# Patient Record
Sex: Female | Born: 1990 | Race: Black or African American | Hispanic: No | Marital: Single | Smoking: Never smoker
Health system: Southern US, Community
[De-identification: ages and names within clinical notes are randomized; demographics above are authoritative.]

## PROBLEM LIST (undated history)

## (undated) DIAGNOSIS — B999 Unspecified infectious disease: Secondary | ICD-10-CM

## (undated) HISTORY — PX: NO PAST SURGERIES: SHX2092

---

## 2009-07-03 ENCOUNTER — Inpatient Hospital Stay (HOSPITAL_COMMUNITY): Admission: AD | Admit: 2009-07-03 | Discharge: 2009-07-05 | Payer: Self-pay | Admitting: Obstetrics and Gynecology

## 2009-07-03 ENCOUNTER — Encounter (INDEPENDENT_AMBULATORY_CARE_PROVIDER_SITE_OTHER): Payer: Self-pay | Admitting: Obstetrics and Gynecology

## 2011-04-02 LAB — CBC
HCT: 31.3 % — ABNORMAL LOW (ref 36.0–46.0)
Hemoglobin: 10.7 g/dL — ABNORMAL LOW (ref 12.0–15.0)
MCHC: 34.3 g/dL (ref 30.0–36.0)
MCV: 93.9 fL (ref 78.0–100.0)
RBC: 3.33 MIL/uL — ABNORMAL LOW (ref 3.87–5.11)
RDW: 14.3 % (ref 11.5–15.5)

## 2011-05-09 NOTE — H&P (Signed)
Emma Ryan, Emma Ryan        ACCOUNT NO.:  [id number]   MEDICAL RECORD NO.:  [id number]          PATIENT TYPE:  INP   LOCATION:  9140                          [facility]ILITY:  [facility]   PHYSICIAN:  Janine Limbo, M.D.DATE OF BIRTH:  10-30-1991   DATE OF ADMISSION:  07/03/2009  DATE OF DISCHARGE:                              HISTORY & PHYSICAL   HISTORY OF PRESENT ILLNESS:  The patient is a 20 year old gravida 1,  para 0 admitted at 37 weeks and 2 days' gestation in active labor.  The  patient reports onset of regular uterine contractions at 6 a.m. on July 03, 2009.  The patient with no leakage of fluid or bleeding on  admission.  The patient reports her fetus has been moving normally.  The  patient denies any headaches, vision changes, or right upper quadrant  pain on admission.  The patient with no prenatal records available and  the patient states that she has only been seen at [facility] for approximately the past 4 weeks.  The patient had been  followed by the CNM Service, [facility] and desires CNM care.   The patient's pregnancy is remarkable for:  1. Limited prenatal care and late entry to care.  2. Teen pregnancy.   HISTORY OF PRESENT PREGNANCY:  Again as noted above.  No prenatal  records are available; however, per patient history, the patient began  prenatal care at [facility] at approximately 33 weeks.  The  patient did state that she had one ultrasound done at another clinic  somewhere in [place] at around 5-1/2 months per patient.  The patient  denies any other complications with her pregnancy.   PRENATAL LABS:  Prenatal labs were obtained on June 18, 2009.  Antibody  screen negative.  Sickle cell screen negative.  HIV nonreactive.  One-  hour Glucola 108.  Group B strep was negative.  Gonorrhea and Chlamydia  tests were negative.  WBC 10.9, hemoglobin 11.8, platelets 259,000.  Hepatitis B surface antigen negative.  Rubella titer immune.   RPR  nonreactive.  Blood type A positive.  Antibody screen negative.   History of pregnancy #1 is current.   GYN HISTORY:  The patient denies any history of abnormal Pap smears.  The patient reports a history of chlamydia, which has been treated in  2008.  The patient denies any contraceptive use in the past.   PAST MEDICAL HISTORY:  Negative.   PAST SURGICAL HISTORY:  Negative.   CURRENT MEDICATIONS:  None.   ALLERGIES:  No known drug allergies.   FAMILY HISTORY:  Father with hypertension.  Paternal grandfather with  diabetes.   GENETIC HISTORY:  Negative.   SOCIAL HISTORY:  The patient is single.  Father of the baby, Charlyne Mom.  Plan is to be involved.  The patient is currently unemployed  and a recent hospital graduate.  Father of the baby is currently  unemployed and is a Engineer, agricultural as well.  The patient denies  use of tobacco, alcohol, or street drugs.  The patient's domestic  violence screen is negative.   OBJECTIVE:  VITAL SIGNS:  The patient is afebrile on admission.  Blood  pressure 132/58, pulse 68, respirations 16.  GENERAL:  The patient is alert and oriented.  No apparent distress.  The  patient is moaning and breathing with uterine contractions.  SKIN:  Warm and dry.  Color satisfactory.  HEART:  Regular rate and rhythm without murmur, rub, or gallop.  LUNGS:  Clear.  BREASTS:  Soft.  ABDOMEN:  Soft, gravid, and nontender with fundal height 37 cm.  GU:  The patient's fetus is vertex to [employer].  Estimated fetal  weight approximately 5.5 pounds.  External fetal monitor with fetal  heart rate baseline 120s with accelerations and variability present and  no decels present upon admission.  Uterine contractions every 2 minutes  and moderate to palpation.  Sterile vaginal exam of the cervix found  cervix to be 5 cm dilated, 80% effaced, -1 station, vertex, and bulging  bag of water.  EXTREMITIES:  Negative edema.  Negative Homans  bilaterally.  Deep tendon  reflexes are 2+, no clonus.   ASSESSMENT:  1. Intrauterine pregnancy at 37 weeks and 2 days' gestation.  2. Labor.   PLAN:  The patient to be admitted to birthing suites per consult with  Dr. Stefano Gaul.  The patient desires epidural anesthesia with labor and  will be placed when she proceeds to birthing suites.      Rhona Leavens, CNM      Janine Limbo, M.D.  Electronically Signed    NOS/MEDQ  D:  07/04/2009  T:  07/04/2009  Job:  [id number]

## 2011-06-04 ENCOUNTER — Observation Stay (HOSPITAL_COMMUNITY)
Admission: AD | Admit: 2011-06-04 | Discharge: 2011-06-04 | DRG: 780 | Disposition: A | Discharge status: Home / Self Care | Payer: Medicaid Other | Source: Ambulatory Visit | Attending: Obstetrics and Gynecology | Admitting: Obstetrics and Gynecology

## 2011-06-04 DIAGNOSIS — O479 False labor, unspecified: Principal | ICD-10-CM | POA: Diagnosis present

## 2011-06-04 LAB — CBC
HCT: 35.5 % — ABNORMAL LOW (ref 36.0–46.0)
MCH: 29.8 pg (ref 26.0–34.0)
MCV: 88.1 fL (ref 78.0–100.0)
RDW: 13.3 % (ref 11.5–15.5)
WBC: 13.3 10*3/uL — ABNORMAL HIGH (ref 4.0–10.5)

## 2011-06-22 ENCOUNTER — Inpatient Hospital Stay (HOSPITAL_COMMUNITY)
Admission: AD | Admit: 2011-06-22 | Discharge: 2011-06-25 | DRG: 775 | Disposition: A | Discharge status: Home / Self Care | Payer: Medicaid Other | Source: Ambulatory Visit | Attending: Obstetrics and Gynecology | Admitting: Obstetrics and Gynecology

## 2011-06-23 LAB — CBC
MCH: 30 pg (ref 26.0–34.0)
MCHC: 33.8 g/dL (ref 30.0–36.0)
MCV: 88.7 fL (ref 78.0–100.0)
Platelets: 225 10*3/uL (ref 150–400)
RBC: 4.17 MIL/uL (ref 3.87–5.11)

## 2011-07-17 NOTE — Discharge Summary (Signed)
NAMETSURUKO, MURTHA        ACCOUNT NO.:  [id number]  MEDICAL RECORD NO.:  [id number]  LOCATION:  9168                          [facility]ILITY:  [facility]  PHYSICIAN:  Crist Fat. Rivard, M.D. DATE OF BIRTH:  09/21/1991  DATE OF ADMISSION:  06/04/2011 DATE OF DISCHARGE:  06/04/2011                              DISCHARGE SUMMARY   REASON FOR ADMISSION:  Labor.  The patient is a gravida 2, para 1, 38 weeks and 2 days arrived via EMS with complaint of contractions.  PRENATAL PROCEDURES:  None.  INTRAPARTUM PROCEDURES:  The patient received external fetal monitoring.  COMPLICATIONS:  None.  HOSPITAL COURSE:  The patient remained stable with no cervical change overnight.  Fetal heart rate remained reassuring.  DISCHARGE DIAGNOSIS:  False labor, term.  DISCHARGE INSTRUCTIONS:  Fetal kick counts.  Call for contractions, rupture of membranes, or vaginal bleeding.  MEDICATIONS:  Prenatal vitamins.  The patient was discharged home in stable condition with instructions to follow up routine at [facility].    ______________________________ Sanda Klein, CNM   ______________________________ Crist Fat. Rivard, M.D.    SL/MEDQ  D:  07/16/2011  T:  07/17/2011  Job:  [id number]

## 2012-07-25 DIAGNOSIS — B999 Unspecified infectious disease: Secondary | ICD-10-CM

## 2012-07-25 HISTORY — DX: Unspecified infectious disease: B99.9

## 2012-08-23 ENCOUNTER — Emergency Department (HOSPITAL_COMMUNITY)
Admission: EM | Admit: 2012-08-23 | Discharge: 2012-08-23 | Disposition: A | Discharge status: Home / Self Care | Payer: Self-pay | Source: Home / Self Care

## 2012-08-23 ENCOUNTER — Encounter (HOSPITAL_COMMUNITY): Payer: Self-pay

## 2012-08-23 DIAGNOSIS — Z3490 Encounter for supervision of normal pregnancy, unspecified, unspecified trimester: Secondary | ICD-10-CM

## 2012-08-23 DIAGNOSIS — N72 Inflammatory disease of cervix uteri: Secondary | ICD-10-CM

## 2012-08-23 DIAGNOSIS — Z331 Pregnant state, incidental: Secondary | ICD-10-CM

## 2012-08-23 LAB — POCT URINALYSIS DIP (DEVICE)
Ketones, ur: NEGATIVE mg/dL
Leukocytes, UA: NEGATIVE
Protein, ur: 30 mg/dL — AB
Specific Gravity, Urine: >=1.03 (ref 1.005–1.030)
Urobilinogen, UA: 0.2 mg/dL (ref 0.0–1.0)
pH: 6 (ref 5.0–8.0)

## 2012-08-23 LAB — WET PREP, GENITAL

## 2012-08-23 MED ORDER — LIDOCAINE HCL (PF) 1 % IJ SOLN
INTRAMUSCULAR | Status: AC
  Filled 2012-08-23: qty 5

## 2012-08-23 MED ORDER — AZITHROMYCIN 250 MG PO TABS
ORAL_TABLET | ORAL | Status: AC
  Filled 2012-08-23: qty 4

## 2012-08-23 MED ORDER — PRENATAL COMPLETE 14-0.4 MG PO TABS: 1.0000 | ORAL_TABLET | Freq: Every morning | ORAL | Status: DC

## 2012-08-23 MED ORDER — AZITHROMYCIN 250 MG PO TABS
1000.0000 mg | ORAL_TABLET | Freq: Once | ORAL | Status: AC
  Administered 2012-08-23: 1000 mg via ORAL

## 2012-08-23 MED ORDER — CEFTRIAXONE SODIUM 250 MG IJ SOLR
250.0000 mg | Freq: Once | INTRAMUSCULAR | Status: AC
  Administered 2012-08-23: 250 mg via INTRAMUSCULAR

## 2012-08-23 MED ORDER — CEFTRIAXONE SODIUM 250 MG IJ SOLR
INTRAMUSCULAR | Status: AC
  Filled 2012-08-23: qty 250

## 2012-08-23 NOTE — ED Notes (Signed)
Sexually active, no BC, irregular menses, LMP June?; c/o brown vaginal d/c ; NAD

## 2012-08-23 NOTE — ED Provider Notes (Signed)
Medical screening examination/treatment/procedure(s) were performed by non-physician practitioner and as supervising physician I was immediately available for consultation/collaboration.  Leslee Home, M.D.   Reuben Likes, MD 08/23/12 [phone]

## 2012-08-23 NOTE — ED Provider Notes (Signed)
History     CSN: [id number]  Arrival date & time 08/23/12  1222   None     No chief complaint on file.   (Consider location/radiation/quality/duration/timing/severity/associated sxs/prior treatment) Patient is a 21 y.o. female presenting with vaginal discharge. The history is provided by the patient. No language interpreter was used.  Vaginal Discharge This is a new problem. The current episode started more than 2 days ago. The problem occurs constantly. The problem has been gradually worsening. Pertinent negatives include no chest pain. Nothing aggravates the symptoms. Nothing relieves the symptoms. She has tried nothing for the symptoms.  Pt reports her last period was in June.  No past medical history on file.  No past surgical history on file.  No family history on file.  History  Substance Use Topics  . Smoking status: Not on file  . Smokeless tobacco: Not on file  . Alcohol Use: Not on file    OB History    No data available      Review of Systems  Cardiovascular: Negative for chest pain.  Genitourinary: Positive for vaginal discharge.  All other systems reviewed and are negative.    Allergies  Review of patient's allergies indicates not on file.  Home Medications  No current outpatient prescriptions on file.  There were no vitals taken for this visit.  Physical Exam  Nursing note and vitals reviewed. Constitutional: She is oriented to person, place, and time. She appears well-developed and well-nourished.  HENT:  Head: Normocephalic.  Eyes: Conjunctivae and EOM are normal. Pupils are equal, round, and reactive to light.  Neck: Normal range of motion. Neck supple.  Cardiovascular: Normal rate.   Pulmonary/Chest: Effort normal and breath sounds normal.  Abdominal: Soft. Bowel sounds are normal. There is no tenderness.  Genitourinary: Vaginal discharge found.       Uterus enlarged,  No adnexa tenderness,  Yellow discharge  Musculoskeletal: Normal  range of motion.  Neurological: She is alert and oriented to person, place, and time. She has normal reflexes.  Skin: Skin is warm.  Psychiatric: She has a normal mood and affect.    ED Course  Procedures (including critical care time)  Labs Reviewed - No data to display No results found.   1. Pregnancy   2. Cervicitis       MDM  Rocephin and zithromax        Lonia Skinner [place], [place] 08/23/12 1534  Lonia Skinner [place], [place] 08/23/12 1600

## 2012-08-27 LAB — GC/CHLAMYDIA PROBE AMP, GENITAL: Chlamydia, DNA Probe: POSITIVE — AB

## 2013-03-10 ENCOUNTER — Inpatient Hospital Stay (HOSPITAL_COMMUNITY)
Admission: AD | Admit: 2013-03-10 | Discharge: 2013-03-12 | DRG: 775 | Disposition: A | Discharge status: Home / Self Care | Payer: Medicaid Other | Source: Ambulatory Visit | Attending: Obstetrics & Gynecology | Admitting: Obstetrics & Gynecology

## 2013-03-10 ENCOUNTER — Encounter (HOSPITAL_COMMUNITY): Payer: Self-pay | Admitting: *Deleted

## 2013-03-10 DIAGNOSIS — O0930 Supervision of pregnancy with insufficient antenatal care, unspecified trimester: Secondary | ICD-10-CM

## 2013-03-10 HISTORY — DX: Unspecified infectious disease: B99.9

## 2013-03-10 LAB — RAPID HIV SCREEN (WH-MAU): Rapid HIV Screen: NONREACTIVE

## 2013-03-10 LAB — HEPATITIS B SURFACE ANTIGEN: Hepatitis B Surface Ag: NEGATIVE

## 2013-03-10 LAB — CBC
Hemoglobin: 12.6 g/dL (ref 12.0–15.0)
MCH: 29.7 pg (ref 26.0–34.0)
MCHC: 33.7 g/dL (ref 30.0–36.0)
Platelets: 248 10*3/uL (ref 150–400)

## 2013-03-10 LAB — RAPID URINE DRUG SCREEN, HOSP PERFORMED
Opiates: NOT DETECTED
Tetrahydrocannabinol: POSITIVE — AB

## 2013-03-10 LAB — RPR: RPR Ser Ql: NONREACTIVE

## 2013-03-10 MED ORDER — ONDANSETRON HCL 4 MG PO TABS: 4.0000 mg | ORAL_TABLET | ORAL | Status: DC | PRN

## 2013-03-10 MED ORDER — INFLUENZA VIRUS VACC SPLIT PF IM SUSP
0.5000 mL | INTRAMUSCULAR | Status: AC
  Administered 2013-03-11: 0.5 mL via INTRAMUSCULAR

## 2013-03-10 MED ORDER — ZOLPIDEM TARTRATE 5 MG PO TABS: 5.0000 mg | ORAL_TABLET | Freq: Every evening | ORAL | Status: DC | PRN

## 2013-03-10 MED ORDER — DIBUCAINE 1 % RE OINT
1.0000 "application " | TOPICAL_OINTMENT | RECTAL | Status: DC | PRN
  Filled 2013-03-10: qty 28

## 2013-03-10 MED ORDER — DIPHENHYDRAMINE HCL 25 MG PO CAPS: 25.0000 mg | ORAL_CAPSULE | Freq: Four times a day (QID) | ORAL | Status: DC | PRN

## 2013-03-10 MED ORDER — SIMETHICONE 80 MG PO CHEW
80.0000 mg | CHEWABLE_TABLET | ORAL | Status: DC | PRN
  Filled 2013-03-10: qty 1

## 2013-03-10 MED ORDER — PNEUMOCOCCAL VAC POLYVALENT 25 MCG/0.5ML IJ INJ
0.5000 mL | INJECTION | INTRAMUSCULAR | Status: AC
  Filled 2013-03-10: qty 0.5

## 2013-03-10 MED ORDER — IBUPROFEN 600 MG PO TABS
600.0000 mg | ORAL_TABLET | Freq: Four times a day (QID) | ORAL | Status: DC
  Administered 2013-03-10 – 2013-03-12 (×9): 600 mg via ORAL
  Filled 2013-03-10 (×9): qty 1

## 2013-03-10 MED ORDER — WITCH HAZEL-GLYCERIN EX PADS: 1.0000 "application " | MEDICATED_PAD | CUTANEOUS | Status: DC | PRN

## 2013-03-10 MED ORDER — BENZOCAINE-MENTHOL 20-0.5 % EX AERO
1.0000 "application " | INHALATION_SPRAY | CUTANEOUS | Status: DC | PRN
  Administered 2013-03-11: 1 via TOPICAL
  Filled 2013-03-10 (×2): qty 56

## 2013-03-10 MED ORDER — MEASLES, MUMPS & RUBELLA VAC ~~LOC~~ INJ
0.5000 mL | INJECTION | Freq: Once | SUBCUTANEOUS | Status: DC
  Filled 2013-03-10: qty 0.5

## 2013-03-10 MED ORDER — OXYTOCIN 10 UNIT/ML IJ SOLN
INTRAMUSCULAR | Status: AC
  Administered 2013-03-10: 10 [IU] via INTRAMUSCULAR
  Filled 2013-03-10: qty 1

## 2013-03-10 MED ORDER — OXYTOCIN 40 UNITS IN LACTATED RINGERS INFUSION - SIMPLE MED: 62.5000 mL/h | INTRAVENOUS | Status: DC | PRN

## 2013-03-10 MED ORDER — LANOLIN HYDROUS EX OINT: TOPICAL_OINTMENT | CUTANEOUS | Status: DC | PRN

## 2013-03-10 MED ORDER — FERROUS SULFATE 325 (65 FE) MG PO TABS
325.0000 mg | ORAL_TABLET | Freq: Two times a day (BID) | ORAL | Status: DC
  Administered 2013-03-10 – 2013-03-12 (×3): 325 mg via ORAL
  Filled 2013-03-10 (×3): qty 1

## 2013-03-10 MED ORDER — OXYTOCIN 10 UNIT/ML IJ SOLN: 10.0000 [IU] | Freq: Once | INTRAMUSCULAR | Status: AC

## 2013-03-10 MED ORDER — FLEET ENEMA 7-19 GM/118ML RE ENEM: 1.0000 | ENEMA | Freq: Every day | RECTAL | Status: DC | PRN

## 2013-03-10 MED ORDER — BISACODYL 10 MG RE SUPP
10.0000 mg | Freq: Every day | RECTAL | Status: DC | PRN
  Filled 2013-03-10: qty 1

## 2013-03-10 MED ORDER — OXYCODONE-ACETAMINOPHEN 5-325 MG PO TABS
1.0000 | ORAL_TABLET | ORAL | Status: DC | PRN
  Administered 2013-03-10: 2 via ORAL
  Administered 2013-03-10 – 2013-03-12 (×5): 1 via ORAL
  Filled 2013-03-10 (×5): qty 1
  Filled 2013-03-10: qty 2

## 2013-03-10 MED ORDER — TETANUS-DIPHTH-ACELL PERTUSSIS 5-2.5-18.5 LF-MCG/0.5 IM SUSP
0.5000 mL | Freq: Once | INTRAMUSCULAR | Status: AC
  Administered 2013-03-11: 0.5 mL via INTRAMUSCULAR

## 2013-03-10 MED ORDER — ONDANSETRON HCL 4 MG/2ML IJ SOLN: 4.0000 mg | INTRAMUSCULAR | Status: DC | PRN

## 2013-03-10 MED ORDER — PRENATAL MULTIVITAMIN CH
1.0000 | ORAL_TABLET | Freq: Every day | ORAL | Status: DC
  Administered 2013-03-10 – 2013-03-12 (×3): 1 via ORAL
  Filled 2013-03-10 (×4): qty 1

## 2013-03-10 MED ORDER — SENNOSIDES-DOCUSATE SODIUM 8.6-50 MG PO TABS
2.0000 | ORAL_TABLET | Freq: Every day | ORAL | Status: DC
  Administered 2013-03-10 – 2013-03-11 (×2): 2 via ORAL

## 2013-03-10 NOTE — MAU Note (Signed)
Pt called out, preparing to transfer. Pt was on floor when entered saying she wanted to push.

## 2013-03-10 NOTE — H&P (Addendum)
Emma Ryan is a 22 y.o. female presenting for active labor. No prenatal care. Denies any complications of previous pregnancies or deliveries. Children aged 3 and 1 live with her. Did not have Medicaid and recently moved so unable to establish care.      Maternal Medical History:  Reason for admission: Contractions.   Contractions: Onset was 6-12 hours ago.   Frequency: regular.   Perceived severity is strong.    Fetal activity: Perceived fetal activity is normal.    Prenatal complications: no prenatal complications   OB History   Grav Para Term Preterm Abortions TAB SAB Ect Mult Living   3 2 2  0 0 0 0 0 0 2     Past Medical History  Diagnosis Date  . Infection 08/13    Chlamydia and Gonorrhea- was treated   Past Surgical History  Procedure Laterality Date  . No past surgeries     Family History: family history includes Diabetes in her paternal grandfather and Hypertension in her maternal aunt.  There is no history of Hearing loss. Social History:  reports that she has quit smoking. She has never used smokeless tobacco. She reports that she does not drink alcohol or use illicit drugs.  Former smoker, stopped during pregnancy. Denies alcohol or illicit drugs.  Prenatal Transfer Tool  Maternal Diabetes: No Genetic Screening: Declined Maternal Ultrasounds/Referrals: Declined Fetal Ultrasounds or other Referrals:  None Maternal Substance Abuse:  Yes:  Type: Smoker Significant Maternal Medications:  None Significant Maternal Lab Results:  None Other Comments:  No prenatal care. GBS unknown. No genetic testing or ultrasounds. UDS pending.   Review of Systems  Unable to perform ROS: acuity of condition    Dilation: 10 Effacement (%): 90 Station: 0 Exam by:: jolynn Blood pressure 125/87, pulse 71, temperature 98.3 F (36.8 C), temperature source Oral, resp. rate 18. Maternal Exam:  Uterine Assessment: Contraction strength is firm.  Contraction frequency  is regular.   Abdomen: Fetal presentation: vertex     Physical Exam  Constitutional: She is oriented to person, place, and time. She appears well-developed and well-nourished. She appears distressed.  HENT:  Head: Normocephalic and atraumatic.  Eyes: Conjunctivae and EOM are normal.  Cardiovascular: Normal rate.   Respiratory: Effort normal. No respiratory distress.  Genitourinary:  Cervix:  10/100/+2  Neurological: She is alert and oriented to person, place, and time.  Skin: Skin is warm and dry.  Psychiatric: She has a normal mood and affect.    Prenatal labs: ABO, Rh:   Antibody:   Rubella:   RPR:    HBsAg:    HIV:    GBS:     Assessment/Plan: 22 y.o. G3P2002 presenting at unknown gestational age in active labor, completely dilated.  Membranes ruptured in MAU and patient with urge to push. Delivered in MAU bed. Baby viable, vigorous. See delivery note. Mother stable. Cord blood samples sent. Admission labs and orders placed. Mother transferred to postpartum.   Napoleon Form 03/10/2013, 11:32 AM   At delivery the baby appeared to be 37-40 weeks in gestational age.   Kevin Fenton, MD 03/12/2013, [id number]

## 2013-03-10 NOTE — H&P (Signed)
Agree with above note.  LEGGETT,KELLY H. 03/10/2013 12:46 PM

## 2013-03-10 NOTE — MAU Note (Signed)
Contractions started last night, coming fast now.  No prenatal care.   No bleeding or leaking.

## 2013-03-11 NOTE — Discharge Summary (Signed)
Obstetric Discharge Summary Reason for Admission: onset of labor Prenatal Procedures: NST Intrapartum Procedures: spontaneous vaginal delivery Postpartum Procedures: none Complications-Operative and Postpartum: none Hemoglobin  Date Value Range Status  03/10/2013 12.6  12.0 - 15.0 g/dL Final     HCT  Date Value Range Status  03/10/2013 37.4  36.0 - 46.0 % Final    Physical Exam:  General: alert, cooperative and appears stated age Cardiac: RRR, no m/g/r Lungs: CLAB, no w/r/r Lochia: appropriate Uterine Fundus: firm DVT Evaluation: No evidence of DVT seen on physical exam. No cords or calf tenderness.  Discharge Diagnoses: Term Pregnancy-delivered  Discharge Information: Date: 03/11/2013 Activity: pelvic rest Diet: routine Medications: None Condition: stable Instructions: refer to practice specific booklet Discharge to: home Follow-up Information   Follow up with [facility] In 6 weeks.   Contact information:   [address] [place] [address] [phone]      Newborn Data: Live born female  Birth Weight: 5 lb 12.1 oz (2610 g) APGAR: 9, 9  Home with mother.  Gregor Hams 03/12/2013, 7:54 AM  Evaluation and management procedures were performed by Resident physician under my supervision/collaboration. Chart reviewed, patient examined by me and I agree with management and plan. Danae Orleans, CNM 03/12/2013 10:04 AM

## 2013-03-11 NOTE — Progress Notes (Signed)
Post Partum Day 1 Subjective: no complaints, up ad lib, voiding, tolerating PO and + flatus  Objective: Blood pressure 118/79, pulse 62, temperature 98 F (36.7 C), temperature source Oral, resp. rate 18, SpO2 94.00%, unknown if currently breastfeeding.  Physical Exam:  General: alert, cooperative and appears stated age Lochia: appropriate Uterine Fundus: firm DVT Evaluation: No evidence of DVT seen on physical exam. No cords or calf tenderness.   Recent Labs  03/10/13 1020  HGB 12.6  HCT 37.4    Assessment/Plan: Plan for discharge tomorrow, Social Work consult and Contraception BTL, paper work pending   LOS: 1 day   Gregor Hams 03/11/2013, 7:59 AM

## 2013-03-11 NOTE — Progress Notes (Signed)
UR chart review completed.  

## 2013-03-11 NOTE — Clinical Social Work Maternal (Signed)
    Clinical Social Work Department PSYCHOSOCIAL ASSESSMENT - MATERNAL/CHILD 03/11/2013  Patient:  Emma, Ryan  Account Number:  [id number]  Admit Date:  03/10/2013  Marjo Bicker Name:   Lucille Passy    Clinical Social Worker:  Nobie Putnam, LCSW   Date/Time:  03/11/2013 12:55 PM  Date Referred:  03/11/2013   Referral source  CN     Referred reason  Other - See comment   Other referral source:    I:  FAMILY / HOME ENVIRONMENT Child's legal guardian:  PARENT  Guardian - Name Guardian - Age Guardian - Address  [place] [address]; [place], [place] [address]  Elio Forget 29    Other household support members/support persons Name Relationship DOB   DAUGHTER 62 years old   SON 14 year old   Other support:   Charlene [place], pt's mother    II  PSYCHOSOCIAL DATA Information Source:  Patient Interview  Event organiser Employment:   Surveyor, quantity resources:  Self Pay If [employer]:   Other  WIC  Food Stamps   School / Grade:   Maternity Care Coordinator / Child Services Coordination / Early Interventions:  Cultural issues impacting care:    III  STRENGTHS Strengths  Adequate Resources  Home prepared for Child (including basic supplies)  Supportive family/friends   Strength comment:    IV  RISK FACTORS AND CURRENT PROBLEMS Current Problem:  YES   Risk Factor & Current Problem Patient Issue Family Issue Risk Factor / Current Problem Comment  Other - See comment Y N NPNC   N N     V  SOCIAL WORK ASSESSMENT CSW met with pt to assess the reason for [facility].  Pt did not receive PNC because she was in the process of moving out of her mothers home into an apartment.  Additionally, pt explained that she applied for Medicaid but failed to comply with child support requirements for other children's father & therefore was not eligible.  Pt then stated that she applied for pregnancy Medicaid however benefits were never  approved.  Pt denies any illegal substance use even though she is positive for MJ on admission.  Pt told CSW that she has not smoked MJ in 4 years but was around MJ majority of pregnancy.  CSW explained hospital drug testing policy & pt verbalized understanding.  UDS is negative, meconium results are pending.  She has all the necessary supplies for the infant & good support.  She denies any depression or SI history. She is not sure if FOB will be involved at this time.  CSW will continue to monitor drug screen results & make a referral if needed.      VI SOCIAL WORK PLAN Social Work Plan  No Further Intervention Required / No Barriers to Discharge   Type of pt/family education:   If child protective services report - county:   If child protective services report - date:   Information/referral to community resources comment:   Other social work plan:

## 2013-03-13 NOTE — H&P (Signed)
Agree with above note.  Emma Ryan H. 03/13/2013 3:22 PM

## 2013-03-14 ENCOUNTER — Encounter: Payer: Self-pay | Admitting: *Deleted

## 2013-03-25 ENCOUNTER — Encounter (HOSPITAL_COMMUNITY): Payer: Self-pay | Admitting: *Deleted

## 2014-07-02 ENCOUNTER — Inpatient Hospital Stay (HOSPITAL_COMMUNITY)
Admission: AD | Admit: 2014-07-02 | Discharge: 2014-07-04 | DRG: 776 | Disposition: A | Discharge status: Home / Self Care | Source: Ambulatory Visit | Attending: Obstetrics & Gynecology | Admitting: Obstetrics & Gynecology

## 2014-07-02 ENCOUNTER — Encounter (HOSPITAL_COMMUNITY): Payer: Self-pay

## 2014-07-02 DIAGNOSIS — Z833 Family history of diabetes mellitus: Secondary | ICD-10-CM

## 2014-07-02 DIAGNOSIS — O0930 Supervision of pregnancy with insufficient antenatal care, unspecified trimester: Secondary | ICD-10-CM

## 2014-07-02 DIAGNOSIS — Z87891 Personal history of nicotine dependence: Secondary | ICD-10-CM

## 2014-07-02 LAB — RAPID URINE DRUG SCREEN, HOSP PERFORMED
AMPHETAMINES: NOT DETECTED
Barbiturates: NOT DETECTED
Benzodiazepines: NOT DETECTED
Cocaine: NOT DETECTED
Opiates: NOT DETECTED
TETRAHYDROCANNABINOL: NOT DETECTED

## 2014-07-02 LAB — TYPE AND SCREEN
ABO/RH(D): A POS
ANTIBODY SCREEN: NEGATIVE

## 2014-07-02 LAB — URINALYSIS, ROUTINE W REFLEX MICROSCOPIC
Bilirubin Urine: NEGATIVE
GLUCOSE, UA: NEGATIVE mg/dL
KETONES UR: NEGATIVE mg/dL
Nitrite: NEGATIVE
PROTEIN: 30 mg/dL — AB
Specific Gravity, Urine: 1.025 (ref 1.005–1.030)
Urobilinogen, UA: 1 mg/dL (ref 0.0–1.0)
pH: 6 (ref 5.0–8.0)

## 2014-07-02 LAB — DIFFERENTIAL
Basophils Absolute: 0 10*3/uL (ref 0.0–0.1)
Basophils Relative: 0 % (ref 0–1)
EOS ABS: 0 10*3/uL (ref 0.0–0.7)
Eosinophils Relative: 0 % (ref 0–5)
LYMPHS ABS: 1.9 10*3/uL (ref 0.7–4.0)
LYMPHS PCT: 17 % (ref 12–46)
MONOS PCT: 8 % (ref 3–12)
Monocytes Absolute: 0.9 10*3/uL (ref 0.1–1.0)
NEUTROS PCT: 75 % (ref 43–77)
Neutro Abs: 8.7 10*3/uL — ABNORMAL HIGH (ref 1.7–7.7)

## 2014-07-02 LAB — RAPID HIV SCREEN (WH-MAU): Rapid HIV Screen: NONREACTIVE

## 2014-07-02 LAB — URINE MICROSCOPIC-ADD ON

## 2014-07-02 LAB — CBC
HCT: 35.3 % — ABNORMAL LOW (ref 36.0–46.0)
HEMOGLOBIN: 11.8 g/dL — AB (ref 12.0–15.0)
MCH: 29.6 pg (ref 26.0–34.0)
MCHC: 33.4 g/dL (ref 30.0–36.0)
MCV: 88.7 fL (ref 78.0–100.0)
Platelets: 261 10*3/uL (ref 150–400)
RBC: 3.98 MIL/uL (ref 3.87–5.11)
RDW: 13.4 % (ref 11.5–15.5)
WBC: 11.5 10*3/uL — AB (ref 4.0–10.5)

## 2014-07-02 LAB — HEPATITIS B SURFACE ANTIGEN: HEP B S AG: NEGATIVE

## 2014-07-02 LAB — RPR

## 2014-07-02 MED ORDER — ERYTHROMYCIN 5 MG/GM OP OINT
TOPICAL_OINTMENT | OPHTHALMIC | Status: AC
  Filled 2014-07-02: qty 1

## 2014-07-02 MED ORDER — BENZOCAINE-MENTHOL 20-0.5 % EX AERO
1.0000 "application " | INHALATION_SPRAY | CUTANEOUS | Status: DC | PRN
  Filled 2014-07-02: qty 56

## 2014-07-02 MED ORDER — OXYCODONE-ACETAMINOPHEN 5-325 MG PO TABS
1.0000 | ORAL_TABLET | ORAL | Status: DC | PRN
  Administered 2014-07-02: 2 via ORAL
  Administered 2014-07-02: 1 via ORAL
  Administered 2014-07-02: 2 via ORAL
  Administered 2014-07-03: 1 via ORAL
  Filled 2014-07-02: qty 1
  Filled 2014-07-02: qty 2
  Filled 2014-07-02: qty 1
  Filled 2014-07-02: qty 2

## 2014-07-02 MED ORDER — LANOLIN HYDROUS EX OINT: TOPICAL_OINTMENT | CUTANEOUS | Status: DC | PRN

## 2014-07-02 MED ORDER — ZOLPIDEM TARTRATE 5 MG PO TABS: 5.0000 mg | ORAL_TABLET | Freq: Every evening | ORAL | Status: DC | PRN

## 2014-07-02 MED ORDER — SIMETHICONE 80 MG PO CHEW: 80.0000 mg | CHEWABLE_TABLET | ORAL | Status: DC | PRN

## 2014-07-02 MED ORDER — DIPHENHYDRAMINE HCL 25 MG PO CAPS: 25.0000 mg | ORAL_CAPSULE | Freq: Four times a day (QID) | ORAL | Status: DC | PRN

## 2014-07-02 MED ORDER — TETANUS-DIPHTH-ACELL PERTUSSIS 5-2.5-18.5 LF-MCG/0.5 IM SUSP
0.5000 mL | Freq: Once | INTRAMUSCULAR | Status: AC
  Administered 2014-07-03: 0.5 mL via INTRAMUSCULAR
  Filled 2014-07-02: qty 0.5

## 2014-07-02 MED ORDER — PRENATAL MULTIVITAMIN CH
1.0000 | ORAL_TABLET | Freq: Every day | ORAL | Status: DC
  Administered 2014-07-02 – 2014-07-04 (×3): 1 via ORAL
  Filled 2014-07-02 (×3): qty 1

## 2014-07-02 MED ORDER — DIBUCAINE 1 % RE OINT: 1.0000 "application " | TOPICAL_OINTMENT | RECTAL | Status: DC | PRN

## 2014-07-02 MED ORDER — SENNOSIDES-DOCUSATE SODIUM 8.6-50 MG PO TABS
2.0000 | ORAL_TABLET | ORAL | Status: DC
  Administered 2014-07-03 (×2): 2 via ORAL
  Filled 2014-07-02 (×2): qty 2

## 2014-07-02 MED ORDER — IBUPROFEN 600 MG PO TABS
600.0000 mg | ORAL_TABLET | Freq: Four times a day (QID) | ORAL | Status: DC
  Administered 2014-07-02 – 2014-07-04 (×9): 600 mg via ORAL
  Filled 2014-07-02 (×9): qty 1

## 2014-07-02 MED ORDER — WITCH HAZEL-GLYCERIN EX PADS: 1.0000 "application " | MEDICATED_PAD | CUTANEOUS | Status: DC | PRN

## 2014-07-02 MED ORDER — ONDANSETRON HCL 4 MG/2ML IJ SOLN: 4.0000 mg | INTRAMUSCULAR | Status: DC | PRN

## 2014-07-02 MED ORDER — ONDANSETRON HCL 4 MG PO TABS: 4.0000 mg | ORAL_TABLET | ORAL | Status: DC | PRN

## 2014-07-02 NOTE — MAU Note (Signed)
Came in to jail last night.  Contracting during the night, up at 0430.  srom right before delivery.  SVD of viable at 0631. spon placenta [phone].

## 2014-07-02 NOTE — Progress Notes (Addendum)
Psychosocial Assessment:  CSW met with MOB in her first floor room to complete assessment due to delivering in jail with [facility].  MOB states no plans to parent this baby, even prior to delivering in jail.  She states she went to jail last night and that the baby was "a few weeks early."  She reports her plan was to give the baby to her aunt Noel Gerold, who lives in [place] (although MOB does not know where), after delivering the baby.  CSW asked if she is biologically related to Ms. Hardin Negus and if Ms. Hardin Negus has an attorney to make the transfer of custody legal.  MOB states Ms. Hardin Negus is "like an aunt" and that she does not have an attorney for a legal adoption.  CSW informed MOB that since she will be going back to jail at her discharge and CSW cannot allow the baby to discharge to Ms. Hardin Negus without legal paperwork, CSW will be making a report to [employer], [employer].  CSW asked about MOB's other children.  She reports she has three other children (DeZyre/5, Avion/3 and Devin/1), who normally live with her at her [place], [place] [address] address, but who are staying with their father, Charisse Klinefelter since MOB went to jail last night.  MOB reports infant's FOB is not involved and that he is "out of town" and "unsure if he's coming back."  She did not provide CSW with a name.  Since MOB is not allowed to make calls, due to inmate status, she requests that [place] contact her friend, Tanda Rockers, who has her cell phone, and ask that she look on her Facebook Messenger to locate [employer] number.  CSW agreed.  CSW will follow up with MOB, but she understands that she will discharge back to jail and CPS will assist in making a plan for baby's discharge.  CSW informed MOB of hospital drug screen policy due to [facility].  She stated understanding and no concerns.  Baby's UDS is negative.  CSW will monitor MDS result.  CSW made report to [place] and request  worker come as soon as possible as MOB will most likely be discharged in the morning.  CSW contacted MOB's friend, who provided Ms. Hardin Negus number as: [phone].  CSW will provide number to [place] worker.

## 2014-07-02 NOTE — H&P (Signed)
Emma CivatteCharnelle D Ryan is a 23 y.o. [phone] [id number] at 5217w0d  female presenting [place] [facility]. [facility], and went into spontaneous labor. She delivered a viable infant, and [facility]. She states that she did not have any prenatal care, but that she knew she was due this month.  History OB History   Grav Para Term Preterm Abortions TAB SAB Ect Mult Living   4 3 3  0 0 0 0 0 0 3     Past Medical History  Diagnosis Date  . Infection 08/13    Chlamydia and Gonorrhea- was treated   Past Surgical History  Procedure Laterality Date  . No past surgeries     Family History: family history includes Diabetes in her paternal grandfather; Hypertension in her maternal aunt. There is no history of Hearing loss. Social History:  reports that she has quit smoking. She has never used smokeless tobacco. She reports that she does not drink alcohol or use illicit drugs.   Prenatal Transfer Tool  Maternal Diabetes: No no testing done. No PNC  Genetic Screening: Declined no testing done [facility]  Maternal Ultrasounds/Referrals: Declined no PNC  Fetal Ultrasounds or other Referrals:  Other:  Maternal Substance Abuse:  No Significant Maternal Medications:  None declines, UDS pending  Significant Maternal Lab Results:  None Other Comments:  Patient did not have any prenatal care.   ROS    Blood pressure 120/65, pulse 78, temperature 98.1 F (36.7 C), temperature source Oral, resp. rate 18, last menstrual period 10/02/2013, unknown if currently breastfeeding. Exam Physical Exam  Nursing note and vitals reviewed. Constitutional: She is oriented to person, place, and time. She appears well-developed and well-nourished. No distress.  Cardiovascular: Normal rate.   Respiratory: Effort normal.  GI: Soft. There is no tenderness.  Genitourinary:  Fundus Firm @U  Bleeding scant Perineum intact   Neurological: She is alert and oriented to person,  place, and time.  Skin: Skin is warm and dry.  Psychiatric: She has a normal mood and affect.    Prenatal labs: ABO, Rh:  A pos Antibody:   pending  Rubella:   pending  RPR:   pending  HBsAg:   pending HIV:   pending  GBS:   unknown   Assessment/Plan: 23 y.o. is a [id number] at 5217w0d  NSVD of viable female infant Not planning on breastfeeding Undecided AO:ZHYQMVHQIONGEre:contraception [facility]  Routine care Stat labs pending    Tawnya CrookHogan, Heather Donovan 07/02/2014, 7:31 AM

## 2014-07-02 NOTE — Progress Notes (Signed)
UR chart review completed.  

## 2014-07-03 LAB — GC/CHLAMYDIA PROBE AMP
CT PROBE, AMP APTIMA: NEGATIVE
GC Probe RNA: NEGATIVE

## 2014-07-03 LAB — RUBELLA SCREEN: RUBELLA: 0.84 {index} (ref <0.90)

## 2014-07-03 MED ORDER — MEASLES, MUMPS & RUBELLA VAC ~~LOC~~ INJ
0.5000 mL | INJECTION | Freq: Once | SUBCUTANEOUS | Status: DC
  Filled 2014-07-03: qty 0.5

## 2014-07-03 MED ORDER — MEASLES, MUMPS & RUBELLA VAC ~~LOC~~ INJ
0.5000 mL | INJECTION | Freq: Once | SUBCUTANEOUS | Status: AC
  Administered 2014-07-04: 0.5 mL via SUBCUTANEOUS

## 2014-07-03 NOTE — Progress Notes (Signed)
Post Partum Day 1 Subjective: no complaints, voiding, tolerating PO, + flatus and Doing well this morning. Planning to talk with social work about who baby can go home with given her situation.  Objective: Blood pressure 119/65, pulse 71, temperature 97.8 F (36.6 C), temperature source Oral, resp. rate 18, last menstrual period 10/02/2013, SpO2 98.00%, unknown if currently breastfeeding.  Physical Exam:  General: alert, cooperative and no distress Lochia: appropriate Uterine Fundus: firm Incision: N/A DVT Evaluation: No evidence of DVT seen on physical exam. No cords or calf tenderness.   Recent Labs  07/02/14 0710  HGB 11.8*  HCT 35.3*    Assessment/Plan: Plan for discharge tomorrow, Social Work consult and Circumcision prior to discharge Depo bridge to BTL for contraception.    LOS: 1 day   Nubia Ziesmer G 07/03/2014, 7:31 AM

## 2014-07-03 NOTE — Progress Notes (Signed)
CSW spoke with [employer]. [employer]/CPS worker who completed Safety Assessment with MOB.  She will continue to check out natural supports over the weekend in hopes that someone will be approved so baby can discharge over the weekend.  If CPS decides to petition for custody, a Team Decision Meeting will be held on Monday and baby will remain in the nursery until that time.  CSW ensured that weekend CSW and CPS worker have each other's contact information.   

## 2014-07-03 NOTE — Progress Notes (Signed)
CPS worker/B. Wilson will meet with MOB at the hospital today.   

## 2014-07-04 MED ORDER — MEDROXYPROGESTERONE ACETATE 150 MG/ML IM SUSP
150.0000 mg | Freq: Once | INTRAMUSCULAR | Status: AC
  Administered 2014-07-04: 150 mg via INTRAMUSCULAR
  Filled 2014-07-04: qty 1

## 2014-07-04 MED ORDER — IBUPROFEN 600 MG PO TABS: 600.0000 mg | ORAL_TABLET | Freq: Four times a day (QID) | ORAL | Status: DC

## 2014-07-04 NOTE — Progress Notes (Signed)
This RN went over discharge instructions with patient.  Pt was discharged and left with security guard and the baby went to the Nursery to stay until CPS makes arrangements.

## 2014-07-04 NOTE — Discharge Summary (Signed)
Obstetric Discharge Summary Reason for Admission: onset of labor Prenatal Procedures: lack of [place] (incarcerated) Intrapartum Procedures[place] [facility] [facility] Postpartum Procedures: none Complications-Operative and Postpartum: none Hemoglobin  Date Value Ref Range Status  07/02/2014 11.8* 12.0 - 15.0 g/dL Final     HCT  Date Value Ref Range Status  07/02/2014 35.3* 36.0 - 46.0 % Final    Physical Exam:  General: alert and cooperative Lochia: appropriate Uterine Fundus: firm Incision: healing well DVT Evaluation: No evidence of DVT seen on physical exam. No significant calf/ankle edema.  Discharge Diagnoses: Term Pregnancy-delivered  [facility] Pt went into spontaneous labor and delivered viable infant, both brought via EMS. Pt did not have any PNC.  UDS obtained, negative. GCCT obtained also negative. Pt evaluated and found to be hemodynamically stable. CSW met to discuss extensively situation with patient. Discussion re placement of baby (pt hoping for maternal aunt). Other children are currently residing with their FOB. Apparently this newborn is of different paternity. Circ obtained for infant. Pt given depo prior to discharge. [facility]. Needs f/up in 4-6 weeks post-partum. Possible TDM to be held on Monday.  Discharge Information: Date: 07/04/2014 Activity: pelvic rest Diet: routine Medications: Ibuprofen Condition: stable Instructions: refer to practice specific booklet [facility] to: prison   Newborn Data: Live born female  Birth Weight: 6 lb 1 oz (2750 g) APGAR: , 9  Home with pending CPS evalSkeet Simmer, Melanie 07/04/2014, 8:58 AM

## 2014-07-04 NOTE — Progress Notes (Signed)
Patient seen this date, and condition confirmed as stable. Note reviewed:`````Attestation of Attending Supervision of Advanced Practitioner: Evaluation and management procedures were performed by the PA/NP/CNM/OB Fellow under my supervision/collaboration. Chart reviewed and agree with management and plan.  Chenee Munns V 07/04/2014 9:34 AM

## 2014-07-04 NOTE — Discharge Instructions (Signed)
Vaginal Delivery °Care After °Refer to this sheet in the next few weeks. These discharge instructions provide you with information on caring for yourself after delivery. Your caregiver may also give you specific instructions. Your treatment has been planned according to the most current medical practices available, but problems sometimes occur. Call your caregiver if you have any problems or questions after you go home. °HOME CARE INSTRUCTIONS °· Take over-the-counter or prescription medicines only as directed by your caregiver or pharmacist. °· Do not drink alcohol, especially if you are breastfeeding or taking medicine to relieve pain. °· Do not chew or smoke tobacco. °· Do not use illegal drugs. °· Continue to use good perineal care. Good perineal care includes: °¨ Wiping your perineum from front to back. °¨ Keeping your perineum clean. °· Do not use tampons or douche until your caregiver says it is okay. °· Shower, wash your hair, and take tub baths as directed by your caregiver. °· Wear a well-fitting bra that provides breast support. °· Eat healthy foods. °· Drink enough fluids to keep your urine clear or pale yellow. °· Eat high-fiber foods such as whole grain cereals and breads, brown rice, beans, and fresh fruits and vegetables every day. These foods may help prevent or relieve constipation. °· Follow your cargiver's recommendations regarding resumption of activities such as climbing stairs, driving, lifting, exercising, or traveling. °· Talk to your caregiver about resuming sexual activities. Resumption of sexual activities is dependent upon your risk of infection, your rate of healing, and your comfort and desire to resume sexual activity. °· Try to have someone help you with your household activities and your newborn for at least a few days after you leave the hospital. °· Rest as much as possible. Try to rest or take a nap when your newborn is sleeping. °· Increase your activities gradually. °· Keep all  of your scheduled postpartum appointments. It is very important to keep your scheduled follow-up appointments. At these appointments, your caregiver will be checking to make sure that you are healing physically and emotionally. °SEEK MEDICAL CARE IF:  °· You are passing large clots from your vagina. Save any clots to show your caregiver. °· You have a foul smelling discharge from your vagina. °· You have trouble urinating. °· You are urinating frequently. °· You have pain when you urinate. °· You have a change in your bowel movements. °· You have increasing redness, pain, or swelling near your vaginal incision (episiotomy) or vaginal tear. °· You have pus draining from your episiotomy or vaginal tear. °· Your episiotomy or vaginal tear is separating. °· You have painful, hard, or reddened breasts. °· You have a severe headache. °· You have blurred vision or see spots. °· You feel sad or depressed. °· You have thoughts of hurting yourself or your newborn. °· You have questions about your care, the care of your newborn, or medicines. °· You are dizzy or lightheaded. °· You have a rash. °· You have nausea or vomiting. °· You were breastfeeding and have not had a menstrual period within 12 weeks after you stopped breastfeeding. °· You are not breastfeeding and have not had a menstrual period by the 12th week after delivery. °· You have a fever. °SEEK IMMEDIATE MEDICAL CARE IF:  °· You have persistent pain. °· You have chest pain. °· You have shortness of breath. °· You faint. °· You have leg pain. °· You have stomach pain. °· Your vaginal bleeding saturates two or more sanitary pads   in 1 hour. °MAKE SURE YOU:  °· Understand these instructions. °· Will watch your condition. °· Will get help right away if you are not doing well or get worse. ° ° °Document Released: 12/08/2000 Document Revised: 09/04/2012 Document Reviewed: 08/07/2012 °ExitCare® Patient Information ©2015 [employer]. This information is not intended to  replace advice given to you by your health care provider. Make sure you discuss any questions you have with your health care provider. ° °

## 2014-07-06 ENCOUNTER — Encounter: Payer: Self-pay | Admitting: *Deleted

## 2014-07-07 ENCOUNTER — Encounter: Payer: Self-pay | Admitting: Obstetrics and Gynecology

## 2014-07-07 ENCOUNTER — Encounter: Payer: Self-pay | Admitting: Medical

## 2014-08-05 ENCOUNTER — Ambulatory Visit: Payer: Medicaid Other | Admitting: Obstetrics and Gynecology

## 2014-08-05 ENCOUNTER — Telehealth: Payer: Self-pay | Admitting: General Practice

## 2014-08-05 ENCOUNTER — Encounter: Payer: Self-pay | Admitting: General Practice

## 2014-08-05 NOTE — Telephone Encounter (Signed)
Patient no showed for appt today. Called patient at mobile number and number was invalid. Called patient at home, no answer- left message stating we are calling to reach you because it appears you missed an appt with us today please call our front office staff so we can get this appt rescheduled. Will send letter

## 2014-08-10 ENCOUNTER — Ambulatory Visit: Admitting: Medical

## 2014-10-26 ENCOUNTER — Encounter (HOSPITAL_COMMUNITY): Payer: Self-pay

## 2015-08-24 ENCOUNTER — Inpatient Hospital Stay (HOSPITAL_COMMUNITY)
Admission: AD | Admit: 2015-08-24 | Discharge: 2015-08-26 | DRG: 776 | Disposition: A | Discharge status: Home / Self Care | Payer: Medicaid Other | Source: Ambulatory Visit | Attending: Obstetrics & Gynecology | Admitting: Obstetrics & Gynecology

## 2015-08-24 ENCOUNTER — Encounter (HOSPITAL_COMMUNITY): Payer: Self-pay | Admitting: Family Medicine

## 2015-08-24 DIAGNOSIS — O0930 Supervision of pregnancy with insufficient antenatal care, unspecified trimester: Secondary | ICD-10-CM

## 2015-08-24 DIAGNOSIS — Z590 Homelessness: Secondary | ICD-10-CM

## 2015-08-24 DIAGNOSIS — Z87891 Personal history of nicotine dependence: Secondary | ICD-10-CM | POA: Diagnosis not present

## 2015-08-24 LAB — DIFFERENTIAL
BASOS ABS: 0 10*3/uL (ref 0.0–0.1)
Basophils Relative: 0 % (ref 0–1)
Eosinophils Absolute: 0 10*3/uL (ref 0.0–0.7)
Eosinophils Relative: 0 % (ref 0–5)
LYMPHS ABS: 1.3 10*3/uL (ref 0.7–4.0)
Lymphocytes Relative: 9 % — ABNORMAL LOW (ref 12–46)
MONO ABS: 0.9 10*3/uL (ref 0.1–1.0)
MONOS PCT: 6 % (ref 3–12)
NEUTROS ABS: 12.1 10*3/uL — AB (ref 1.7–7.7)
Neutrophils Relative %: 85 % — ABNORMAL HIGH (ref 43–77)

## 2015-08-24 LAB — RAPID URINE DRUG SCREEN, HOSP PERFORMED
Amphetamines: NOT DETECTED
Barbiturates: NOT DETECTED
Benzodiazepines: NOT DETECTED
COCAINE: NOT DETECTED
OPIATES: NOT DETECTED
Tetrahydrocannabinol: NOT DETECTED

## 2015-08-24 LAB — TYPE AND SCREEN
ABO/RH(D): A POS
ANTIBODY SCREEN: NEGATIVE

## 2015-08-24 LAB — CBC
HEMATOCRIT: 34.5 % — AB (ref 36.0–46.0)
HEMOGLOBIN: 11.7 g/dL — AB (ref 12.0–15.0)
MCH: 29.8 pg (ref 26.0–34.0)
MCHC: 33.9 g/dL (ref 30.0–36.0)
MCV: 87.8 fL (ref 78.0–100.0)
Platelets: 208 10*3/uL (ref 150–400)
RBC: 3.93 MIL/uL (ref 3.87–5.11)
RDW: 14.1 % (ref 11.5–15.5)
WBC: 14.3 10*3/uL — AB (ref 4.0–10.5)

## 2015-08-24 LAB — RAPID HIV SCREEN (HIV 1/2 AB+AG)
HIV 1/2 Antibodies: NONREACTIVE
HIV-1 P24 Antigen - HIV24: NONREACTIVE

## 2015-08-24 LAB — HEPATITIS B SURFACE ANTIGEN: HEP B S AG: NEGATIVE

## 2015-08-24 MED ORDER — ACETAMINOPHEN 325 MG PO TABS: 650.0000 mg | ORAL_TABLET | ORAL | Status: DC | PRN

## 2015-08-24 MED ORDER — ONDANSETRON HCL 4 MG/2ML IJ SOLN: 4.0000 mg | INTRAMUSCULAR | Status: DC | PRN

## 2015-08-24 MED ORDER — INFLUENZA VAC SPLIT QUAD 0.5 ML IM SUSY
0.5000 mL | PREFILLED_SYRINGE | INTRAMUSCULAR | Status: AC
  Administered 2015-08-25: 0.5 mL via INTRAMUSCULAR
  Filled 2015-08-24: qty 0.5

## 2015-08-24 MED ORDER — WITCH HAZEL-GLYCERIN EX PADS: 1.0000 "application " | MEDICATED_PAD | CUTANEOUS | Status: DC | PRN

## 2015-08-24 MED ORDER — OXYCODONE-ACETAMINOPHEN 5-325 MG PO TABS
1.0000 | ORAL_TABLET | ORAL | Status: DC | PRN
  Administered 2015-08-24 – 2015-08-25 (×4): 1 via ORAL
  Filled 2015-08-24 (×4): qty 1

## 2015-08-24 MED ORDER — OXYTOCIN 40 UNITS IN LACTATED RINGERS INFUSION - SIMPLE MED: 62.5000 mL/h | INTRAVENOUS | Status: DC | PRN

## 2015-08-24 MED ORDER — BENZOCAINE-MENTHOL 20-0.5 % EX AERO
1.0000 "application " | INHALATION_SPRAY | CUTANEOUS | Status: DC | PRN
  Filled 2015-08-24: qty 56

## 2015-08-24 MED ORDER — ONDANSETRON HCL 4 MG PO TABS: 4.0000 mg | ORAL_TABLET | ORAL | Status: DC | PRN

## 2015-08-24 MED ORDER — DOCUSATE SODIUM 100 MG PO CAPS
100.0000 mg | ORAL_CAPSULE | Freq: Two times a day (BID) | ORAL | Status: DC
  Administered 2015-08-25 (×3): 100 mg via ORAL
  Filled 2015-08-24 (×3): qty 1

## 2015-08-24 MED ORDER — LANOLIN HYDROUS EX OINT: TOPICAL_OINTMENT | CUTANEOUS | Status: DC | PRN

## 2015-08-24 MED ORDER — PRENATAL MULTIVITAMIN CH
1.0000 | ORAL_TABLET | Freq: Every day | ORAL | Status: DC
  Administered 2015-08-24 – 2015-08-26 (×3): 1 via ORAL
  Filled 2015-08-24 (×4): qty 1

## 2015-08-24 MED ORDER — IBUPROFEN 600 MG PO TABS
600.0000 mg | ORAL_TABLET | Freq: Four times a day (QID) | ORAL | Status: DC
  Administered 2015-08-24 – 2015-08-26 (×9): 600 mg via ORAL
  Filled 2015-08-24 (×9): qty 1

## 2015-08-24 MED ORDER — DIPHENHYDRAMINE HCL 25 MG PO CAPS: 25.0000 mg | ORAL_CAPSULE | Freq: Four times a day (QID) | ORAL | Status: DC | PRN

## 2015-08-24 MED ORDER — SIMETHICONE 80 MG PO CHEW
80.0000 mg | CHEWABLE_TABLET | ORAL | Status: DC | PRN
  Filled 2015-08-24: qty 1

## 2015-08-24 MED ORDER — DIBUCAINE 1 % RE OINT
1.0000 "application " | TOPICAL_OINTMENT | RECTAL | Status: DC | PRN
  Filled 2015-08-24: qty 28

## 2015-08-24 NOTE — MAU Note (Addendum)
Pt arrived by EMS. Delivered at home by Robin Searing senior paramedic.  apgars given by EMS.   Pt received no prenatal care.  This is her 5th child.  States had been contracting during the night. Pains woke her at around 0650.  Water broke when she started pushing right before delivery, fluid was clear.  Dr Alvester Morin called when EMS arrived.  NICU called to medscreen infant

## 2015-08-24 NOTE — MAU Note (Signed)
Possible BUFA; some conversation noted about being a surrogate for someone;

## 2015-08-24 NOTE — Clinical Social Work Maternal (Signed)
CLINICAL SOCIAL WORK MATERNAL/CHILD NOTE  Patient Details  Name: Emma Ryan MRN: [id number] Date of Birth: [birth year]  Date:  08/24/2015  Clinical Social Worker Initiating Note:  Loleta Books, LCSW Date/ Time Initiated:  08/24/15/1200     Child's Name:  Unnamed at time of assessment   Legal Guardian:  Mother   Need for Interpreter:  None   Date of Referral:  08/24/15     Reason for Referral:  Late or No Prenatal Care , Potential Adoption    Referral Source:  [facility]   Address:  [address] [place], [place], [place] [address]  Phone number:  [phone]   Household Members:  Minor Children, Residents of [facility] shelter   Natural Supports (not living in the home):  Immediate Family   Professional Supports: Case Research officer, political party, Shelter   Employment: Full-time   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   Education:      Surveyor, quantity Resources:  [employer]   Other Resources:  Sales executive , WIC   Cultural/Religious Considerations Which May Impact Care:  None reported  Strengths:  Compliance with medical plan , Ability to meet basic needs    Risk Factors/Current Problems:  1) Numerous psychosocial stressors (see detailed narration below)  Cognitive State:  Able to Concentrate , Alert , Goal Oriented , Linear Thinking    Mood/Affect:  Interested , Tearful    CSW Assessment:  CSW received request for consult due to patient arriving after delivering an infant at home with no prenatal care with a potential adoption plan.  Potential adoptive mother, Revonda Standard, was in the room, but was agreeable to allowing CSW to complete assessment in privacy.   CSW assisted the patient to identify and process her numerous thoughts and feelings secondary to the infant's birth story. Patient reflected upon her experiences when she was attempting to get her older kids ready for school, but acknowledging that she was in labor. Patient shared that she called 911, and then arrived to her  home when was starting to push. She discussed that it continues to feel surreal, and continues to adjust to the fast progression of labor.   Per patient, she learned that she was pregnant in January, but immediately felt that she was not in a position to care for another infant. She stated that she tried to gather enough funds for an abortion, but was never able to secure enough money. Patient shared that she never told anyone that she was pregnant, and reflected upon normative feelings that she experienced due to keeping the pregnancy a secret (fear of how her family would respond).  Patient reported unstable housing during the pregnancy, and discussed how she was staying at various family members.  She discussed that her situation became more stressful and complicated once she regained custody of her children. Patient stated that she has a one year old, but that he was staying with a family member from 36 months of age-65 months of age. She shared that her three oldest children were staying with their father until June. Patient expressed intense feelings of happiness since she has been reunited, but reported that due to unstable housing, she continued to be stressed.  Per patient, she moved into [facility] (a homeless shelter) approximately one week ago. She stated that she is beginning to work with her case manager in order to create a plan for her and her children's future. Patient shared that she is currently employed, has an upcoming appointment at DSS for childcare, and is hoping  to secure her own housing in the near future.   Patient identified her pregnancy as "unstable" and shared that for these reasons, she was interested in placing the infant up for adoption. Patient stated that it was only 2 weeks ago, when her sister informed her that a mutual acquaintance may be interested in adopting her infant.  Patient stated that she is familiar with the potential adoptive mother since she worked with her in the  parenting program "[employer]".  Due to the unknown gestation of the infant and the recent identification of the adoptive parents, there have been no adoptive paperwork completed or initiated. Potential adoptive mother reported that she and her husband have been in contact with an adoption attorney who is working with them to complete paperwork. She stated that if the patient chooses to pursue an adoption, the attorney will draft temporary guardianship papers to be completed prior to discharge.  CSW spent significant time with the patient as she continues to process her feelings secondary to placing the infant up for adoption. She originally stated that she is not in a "good place" to care for an infant, but she also acknowledged that she has not had an opportunity to genuinely process how she feels about it.  CSW explored with the MOB her current feelings about the infant, the positive and negative aspects of placing the infant up for adoption and parenting the infant. CSW also explored with MOB how her feelings related to the infant may be changed if in the future if she is able to gain a sense of stability.  Patient became highly tearful and emotional as she discussed her feelings, and stated that she is "confused" and "conflicted".  Patient acknowledged that there is no need to make a decision at this time, and expressed desire to rest and to think.   Patient denied any substance use during the pregnancy, and verbalized understanding of the hospital drug screen policy.   Patient agreeable to ongoing CSW intervention and support, and expressed appreciation for the visit,.  CSW Plan/Description:   1)Patient/Family Education: Hospital drug screen policy 2) CSW to monitor infant's toxicology screens and will make a CPS report if positive. 3)Psychosocial Support and Ongoing Assessment of Needs- CSW followed up with patient at 3:15pm, but she was resting.  CSW to follow up in the morning of 8/31 in  order to continue to provide support to patient as she decides if she wants to parent or place the infant up for adoption.    Pervis Hocking, LCSW 04-03-2015, 3:33 PM

## 2015-08-24 NOTE — H&P (Signed)
OBSTETRIC ADMISSION HISTORY AND PHYSICAL  Emma Ryan is a 24 y.o. female [id number] with IUP, suspected term presenting after delivery at home by EMS. CTX started last night and got "intense at 0630." She did not have any prenatal care.  She delivered at 0753 and placenta was delivered in MAU at [facility]. She plans on breast feeding. She request BTL for birth control.  Prenatal History/Complications:  Past Medical History: Past Medical History  Diagnosis Date  . Infection 08/13    Chlamydia and Gonorrhea- was treated    Past Surgical History: Past Surgical History  Procedure Laterality Date  . No past surgeries      Obstetrical History: OB History    Gravida Para Term Preterm AB TAB SAB Ectopic Multiple Living   0 0 0 0 0 0 4      Social History: Social History   Social History  . Marital Status: Single    Spouse Name: N/A  . Number of Children: N/A  . Years of Education: N/A   Social History Main Topics  . Smoking status: Former Games developer  . Smokeless tobacco: Never Used     Comment: quit prior to preg  . Alcohol Use: No  . Drug Use: No  . Sexual Activity: Not Currently   Other Topics Concern  . Not on file   Social History Narrative    Family History: Family History  Problem Relation Age of Onset  . Hypertension Maternal Aunt   . Diabetes Paternal Grandfather   . Hearing loss Neg Hx     Allergies: No Known Allergies  Prescriptions prior to admission  Medication Sig Dispense Refill Last Dose  . [DISCONTINUED] ibuprofen (ADVIL,MOTRIN) 600 MG tablet Take 1 tablet (600 mg total) by mouth every 6 (six) hours. 30 tablet 0 More than a month at Unknown time    Review of Systems  All systems reviewed and negative except as stated in HPI  unknown if currently breastfeeding. General appearance: alert and cooperative Lungs: clear to auscultation bilaterally Heart: regular rate and rhythm Abdomen: soft, appropriately tender. Fundus is  firm Extremities: Homans sign is negative, no sign of DVT  Prenatal labs: ABO, Rh:   Antibody:   Rubella:   RPR:    HBsAg:    HIV:    GBS:     Prenatal Transfer Tool  Maternal Diabetes: Unknown, no PNC Genetic Screening: None Maternal Ultrasounds/Referrals: Not done Fetal Ultrasounds or other Referrals:  NA Maternal Substance Abuse:  No- ordered UDS Significant Maternal Medications:  None Significant Maternal Lab Results: Lab values include: Other:  GBS UNKNOWN, GC/CT unknown (does have history of positive gonorrhea and chlamydia in 2013 and negative test in July 2015)  No results found for this or any previous visit (from the past 24 hour(s)).  Patient Active Problem List   Diagnosis Date Noted  . NSVD (normal spontaneous vaginal delivery) 07/02/2014    Assessment: Emma Ryan is a 24 y.o. [id number] likely term based on infant exam here for delivery at home  #Labor: NSVD with normal placental delivery intact wit 3 vessel cord. Admit to MB for routine PP care. Will get all prenatal screening labs. Desires BTL, plan for interval BTL.  #ID:  GBS unk #MOF: bottle #MOC: BTL desired #Circ: outpatient   Federico Flake 08/24/2015, 8:39 AM

## 2015-08-25 LAB — RPR: RPR Ser Ql: NONREACTIVE

## 2015-08-25 LAB — RUBELLA SCREEN: Rubella: 5.03 index (ref >0.99)

## 2015-08-25 MED ORDER — MEASLES, MUMPS & RUBELLA VAC ~~LOC~~ INJ
0.5000 mL | INJECTION | Freq: Once | SUBCUTANEOUS | Status: AC
  Administered 2015-08-26: 0.5 mL via SUBCUTANEOUS
  Filled 2015-08-25 (×2): qty 0.5

## 2015-08-25 NOTE — Progress Notes (Signed)
CSW followed up with MOB in order to continue to provide psychosocial support and to continue to assist her to identify her plan for the infant at time of discharge.  MOB continues to be easily engaged and receptive to the visit. She was smiling upon CSW arrival, and reported that she has a "good evening".  MOB stated that she and the potential adoptive mother talked yesterday evening, and the adoptive mother informed her that she is "not able to" adopt this infant. The MOB reported that she felt a sense of relief since was unsure if she wanted to place the infant up for adoption. She stated that she wants the potential adoptive mother to be involved as a "godmother", but she wants to be able to parent this infant. She stated that she has enjoyed spending time with the infant, caring for him, and getting to known.  MOB expressed that she is confident in her decision to parent this infant, and is not currently overwhelmed with all that need to occur prior to infant's discharge.  MOB shared that she has access to a car seat, is current with WIC, and this infant can go to the same pediatrician as her other children. MOB shared that she has a play pen for the infant to sleep in, and the case manager at the shelter will be able to assist with additional basic needs.  MOB discussed that she needs to remain in contact with the case manager at the shelter to notify them that the infant will be returning home with her.  MOB discussed that the godmother of the infant will continue to provide her with support as she transitions postpartum.  MOB was noted to be smiling as she reflected upon these plans, and expressed feelings of happiness with her current plan.   She denied additional questions, concerns, or needs, but agreed to contact CSW if needs arise.   Infant to be discharged to MOB when medically ready. 

## 2015-08-25 NOTE — Progress Notes (Signed)
POSTPARTUM PROGRESS NOTE  Post Partum Day 1 Subjective:  Emma Ryan is a 24 y.o. [id number] Unknown s/p nsvd at home. No prenatal care, dating uncertain but likely term. Homeless, living in s shelter.  No acute events overnight.  Pt denies problems with ambulating, voiding or po intake.  She denies nausea or vomiting.  Pain is well controlled.  She has had flatus. She has not had bowel movement.  Lochia Small.  Plan for birth control is bilateral tubal ligation.  Method of Feeding: bottle  Objective: Blood pressure 133/68, pulse 59, temperature 98 F (36.7 C), temperature source Oral, resp. rate 16, unknown if currently breastfeeding.  Physical Exam:  General: alert, cooperative and no distress Lochia:normal flow Chest: CTAB Heart: RRR no m/r/g Abdomen: +BS, soft, nontender,  Uterine Fundus: firm,  DVT Evaluation: No evidence of DVT seen on physical exam. Extremities: trace edema   Recent Labs  08/24/15 0920  HGB 11.7*  HCT 34.5*    Assessment/Plan:  ASSESSMENT: Emma Ryan is a 24 y.o. [id number] Unknown s/p nsvd at home. No pretnatal care, plan for adoption. SW has seen, will f/u again today. Doing well postpartum, labs obtained yesterday unremarkable, plan will be discharge back to shelter tomorrow. Interval tubal.   LOS: 1 day   Silvano Bilis 08/25/2015, 8:01 AM

## 2015-08-26 MED ORDER — ACETAMINOPHEN 325 MG PO TABS: 650.0000 mg | ORAL_TABLET | ORAL | Status: DC | PRN

## 2015-08-26 MED ORDER — IBUPROFEN 600 MG PO TABS: 600.0000 mg | ORAL_TABLET | Freq: Four times a day (QID) | ORAL | Status: DC

## 2015-08-26 NOTE — Discharge Instructions (Signed)

## 2015-08-26 NOTE — Discharge Summary (Signed)
Obstetric Discharge Summary  Reason for Admission: delivery at home Prenatal Procedures: none Intrapartum Procedures: delivery of placenta Postpartum Procedures: none Complications-Operative and Postpartum: none   Hospital Course:  Principal Problem:   NSVD (normal spontaneous vaginal delivery) Active Problems:   No prenatal care in current pregnancy   Emma Ryan is a 24 y.o. [id number] s/p NSVD at home. Patient is homeless, lives in a shelter, and delivered there. We delivered her placenta shortly after she arrived via EMS. She had had no prenatal care.  She has postpartum course that was uncomplicated including no problems with ambulating, PO intake, urination, pain, or bleeding. Initial plans to adopt baby but adoptive parents changed their mind, and patient is OK with this. Seen by social work, OK with them for d/c back to shelter where patient has a Sports coach. She has transportation, a car seat, and says she has the means to obtain formula. We will refer to our outpatient clinic for PP f/u. She desires a tubal ligation and we will give her paperwork and schedule her for an appointment to discuss that. She had one elevated BP the day of discharge, no symptoms of preeclampsia, and normal BPs on subsequent repeated measurements. The pt feels ready to go home and  will be discharged with outpatient follow-up.   Today: No acute events overnight.  Pt denies problems with ambulating, voiding or po intake.  She denies nausea or vomiting.  Pain is well controlled.  She has had flatus. She has not had bowel movement.  Lochia Small.  Plan for birth control is  interval btl.  Method of Feeding: bottle  Physical Exam:  General: alert, cooperative and appears stated age 67: appropriate Uterine Fundus: firm DVT Evaluation: No evidence of DVT seen on physical exam.  H/H: Lab Results  Component Value Date/Time   HGB 11.7* 08/24/2015 09:20 AM   HCT 34.5* 08/24/2015 09:20 AM     Discharge Diagnoses: Term Pregnancy-delivered  Discharge Information: Date: 08/26/2015 Activity: pelvic rest Diet: routine  Medications: Ibuprofen Breast feeding:  No:  Condition: stable Instructions: refer to handout Discharge to: home   Discharge Instructions    Call MD for:  difficulty breathing, headache or visual disturbances    Complete by:  As directed      Call MD for:  extreme fatigue    Complete by:  As directed      Call MD for:  hives    Complete by:  As directed      Call MD for:  persistant dizziness or light-headedness    Complete by:  As directed      Call MD for:  persistant nausea and vomiting    Complete by:  As directed      Call MD for:  severe uncontrolled pain    Complete by:  As directed      Call MD for:  temperature >100.4    Complete by:  As directed      Call MD for:    Complete by:  As directed      Diet general    Complete by:  As directed      Sexual acrtivity    Complete by:  As directed   Nothing in the vagina for at least 2 weeks            Medication List    TAKE these medications        acetaminophen 325 MG tablet  Commonly known as:  TYLENOL  Take  2 tablets (650 mg total) by mouth every 4 (four) hours as needed (for pain scale < 4).     ibuprofen 600 MG tablet  Commonly known as:  ADVIL,MOTRIN  Take 1 tablet (600 mg total) by mouth every 6 (six) hours.           Follow-up Information    Follow up with [facility] In 6 weeks.   Specialty:  Obstetrics and Gynecology   Contact information:   [address] [place] [address] [phone]      Silvano Bilis ,MD OB Fellow 08/26/2015,12:24 PM

## 2015-08-27 ENCOUNTER — Encounter: Payer: Self-pay | Admitting: *Deleted

## 2015-09-29 ENCOUNTER — Ambulatory Visit: Payer: Self-pay | Admitting: Obstetrics and Gynecology

## 2015-10-06 ENCOUNTER — Encounter (HOSPITAL_COMMUNITY): Payer: Self-pay

## 2015-12-26 NOTE — L&D Delivery Note (Signed)
Delivery Note Patient is a 25 y.o. now [id number] with no prenatal care, who presented to tirage in active labor, SVE by Rn: lip. Pt immediately transferred to L&D.   On recheck, cervix complete with bulging bag. AROM with clear fluid. With a two pushes. head delivered OA, with restitution to the left. No nuchal cord present. Shoulder and body delivered in usual fashion. Infant to mother's abdomen. Cord clamped x 2 after 1-minute delay, and cut. Cord blood drawn. Placenta delivered spontaneously with gentle cord traction. Fundus firm with massage and IM Pitocin. Perineum inspected and found to have small vaginal laceration, which was found to be hemostatic.  At 05:53AM a viable female was delivered via  NSVD.   APGAR: 8, 9; weight  pending Placenta status: intact, minimal calcifications   Cord:  3-vessel  Anesthesia:  none Episiotomy:  none Lacerations:  Small posterior vaginal Suture Repair: none Est. Blood Loss (mL):  100  Mom to postpartum.  Baby to Couplet care / Skin to Skin.  Kandra NicolasJulie P Degele 09/09/2016, 6:21 AM  I have seen and examined this patient and I agree with the above. Cam HaiSHAW, Trevel Dillenbeck CNM 9:18 AM 09/09/2016

## 2016-09-09 ENCOUNTER — Encounter (HOSPITAL_COMMUNITY): Payer: Self-pay | Admitting: *Deleted

## 2016-09-09 ENCOUNTER — Inpatient Hospital Stay (HOSPITAL_COMMUNITY)
Admission: AD | Admit: 2016-09-09 | Discharge: 2016-09-11 | DRG: 775 | Disposition: A | Discharge status: Home / Self Care | Payer: Medicaid Other | Source: Ambulatory Visit | Attending: Obstetrics and Gynecology | Admitting: Obstetrics and Gynecology

## 2016-09-09 DIAGNOSIS — Z833 Family history of diabetes mellitus: Secondary | ICD-10-CM

## 2016-09-09 DIAGNOSIS — Z3A41 41 weeks gestation of pregnancy: Secondary | ICD-10-CM

## 2016-09-09 DIAGNOSIS — Z87891 Personal history of nicotine dependence: Secondary | ICD-10-CM

## 2016-09-09 DIAGNOSIS — IMO0001 Reserved for inherently not codable concepts without codable children: Secondary | ICD-10-CM

## 2016-09-09 DIAGNOSIS — Z8249 Family history of ischemic heart disease and other diseases of the circulatory system: Secondary | ICD-10-CM

## 2016-09-09 DIAGNOSIS — Z3483 Encounter for supervision of other normal pregnancy, third trimester: Secondary | ICD-10-CM | POA: Diagnosis present

## 2016-09-09 LAB — CBC
HCT: 32.4 % — ABNORMAL LOW (ref 36.0–46.0)
Hemoglobin: 11.1 g/dL — ABNORMAL LOW (ref 12.0–15.0)
MCH: 29.3 pg (ref 26.0–34.0)
MCHC: 34.3 g/dL (ref 30.0–36.0)
MCV: 85.5 fL (ref 78.0–100.0)
PLATELETS: 232 10*3/uL (ref 150–400)
RBC: 3.79 MIL/uL — ABNORMAL LOW (ref 3.87–5.11)
RDW: 13.9 % (ref 11.5–15.5)
WBC: 12.3 10*3/uL — ABNORMAL HIGH (ref 4.0–10.5)

## 2016-09-09 LAB — DIFFERENTIAL
Basophils Absolute: 0 10*3/uL (ref 0.0–0.1)
Basophils Relative: 0 %
EOS ABS: 0 10*3/uL (ref 0.0–0.7)
EOS PCT: 0 %
LYMPHS ABS: 3.2 10*3/uL (ref 0.7–4.0)
Lymphocytes Relative: 26 %
Monocytes Absolute: 0.6 10*3/uL (ref 0.1–1.0)
Monocytes Relative: 5 %
NEUTROS PCT: 69 %
Neutro Abs: 8.5 10*3/uL — ABNORMAL HIGH (ref 1.7–7.7)

## 2016-09-09 LAB — RAPID HIV SCREEN (HIV 1/2 AB+AG)
HIV 1/2 ANTIBODIES: NONREACTIVE
HIV-1 P24 ANTIGEN - HIV24: NONREACTIVE

## 2016-09-09 LAB — RAPID URINE DRUG SCREEN, HOSP PERFORMED
Amphetamines: NOT DETECTED
BARBITURATES: NOT DETECTED
BENZODIAZEPINES: NOT DETECTED
Cocaine: NOT DETECTED
Opiates: NOT DETECTED
Tetrahydrocannabinol: NOT DETECTED

## 2016-09-09 LAB — TYPE AND SCREEN
ABO/RH(D): A POS
ANTIBODY SCREEN: NEGATIVE

## 2016-09-09 MED ORDER — OXYTOCIN BOLUS FROM INFUSION: 500.0000 mL | Freq: Once | INTRAVENOUS | Status: DC

## 2016-09-09 MED ORDER — OXYCODONE-ACETAMINOPHEN 5-325 MG PO TABS
1.0000 | ORAL_TABLET | ORAL | Status: DC | PRN
  Administered 2016-09-09: 1 via ORAL
  Filled 2016-09-09: qty 1

## 2016-09-09 MED ORDER — SOD CITRATE-CITRIC ACID 500-334 MG/5ML PO SOLN: 30.0000 mL | ORAL | Status: DC | PRN

## 2016-09-09 MED ORDER — ACETAMINOPHEN 325 MG PO TABS: 650.0000 mg | ORAL_TABLET | ORAL | Status: DC | PRN

## 2016-09-09 MED ORDER — DIPHENHYDRAMINE HCL 25 MG PO CAPS
25.0000 mg | ORAL_CAPSULE | Freq: Four times a day (QID) | ORAL | Status: DC | PRN
Start: 2016-09-09 — End: 2016-09-11

## 2016-09-09 MED ORDER — DIBUCAINE 1 % RE OINT: 1.0000 "application " | TOPICAL_OINTMENT | RECTAL | Status: DC | PRN

## 2016-09-09 MED ORDER — OXYTOCIN 40 UNITS IN LACTATED RINGERS INFUSION - SIMPLE MED: 2.5000 [IU]/h | INTRAVENOUS | Status: DC

## 2016-09-09 MED ORDER — SENNOSIDES-DOCUSATE SODIUM 8.6-50 MG PO TABS
2.0000 | ORAL_TABLET | ORAL | Status: DC
  Administered 2016-09-09: 2 via ORAL
  Filled 2016-09-09: qty 2

## 2016-09-09 MED ORDER — COCONUT OIL OIL: 1.0000 "application " | TOPICAL_OIL | Status: DC | PRN

## 2016-09-09 MED ORDER — ZOLPIDEM TARTRATE 5 MG PO TABS: 5.0000 mg | ORAL_TABLET | Freq: Every evening | ORAL | Status: DC | PRN

## 2016-09-09 MED ORDER — IBUPROFEN 600 MG PO TABS
600.0000 mg | ORAL_TABLET | Freq: Four times a day (QID) | ORAL | Status: DC
  Administered 2016-09-09 – 2016-09-11 (×10): 600 mg via ORAL
  Filled 2016-09-09 (×10): qty 1

## 2016-09-09 MED ORDER — LIDOCAINE HCL (PF) 1 % IJ SOLN
30.0000 mL | INTRAMUSCULAR | Status: DC | PRN
  Filled 2016-09-09: qty 30

## 2016-09-09 MED ORDER — ONDANSETRON HCL 4 MG/2ML IJ SOLN: 4.0000 mg | Freq: Four times a day (QID) | INTRAMUSCULAR | Status: DC | PRN

## 2016-09-09 MED ORDER — LACTATED RINGERS IV SOLN: INTRAVENOUS | Status: DC

## 2016-09-09 MED ORDER — ONDANSETRON HCL 4 MG PO TABS: 4.0000 mg | ORAL_TABLET | ORAL | Status: DC | PRN

## 2016-09-09 MED ORDER — OXYCODONE-ACETAMINOPHEN 5-325 MG PO TABS: 1.0000 | ORAL_TABLET | ORAL | Status: DC | PRN

## 2016-09-09 MED ORDER — LACTATED RINGERS IV SOLN: 500.0000 mL | INTRAVENOUS | Status: DC | PRN

## 2016-09-09 MED ORDER — OXYCODONE-ACETAMINOPHEN 5-325 MG PO TABS: 2.0000 | ORAL_TABLET | ORAL | Status: DC | PRN

## 2016-09-09 MED ORDER — INFLUENZA VAC SPLIT QUAD 0.5 ML IM SUSY
0.5000 mL | PREFILLED_SYRINGE | INTRAMUSCULAR | Status: AC
  Administered 2016-09-10: 0.5 mL via INTRAMUSCULAR
  Filled 2016-09-09: qty 0.5

## 2016-09-09 MED ORDER — ONDANSETRON HCL 4 MG/2ML IJ SOLN: 4.0000 mg | INTRAMUSCULAR | Status: DC | PRN

## 2016-09-09 MED ORDER — BENZOCAINE-MENTHOL 20-0.5 % EX AERO: 1.0000 "application " | INHALATION_SPRAY | CUTANEOUS | Status: DC | PRN

## 2016-09-09 MED ORDER — WITCH HAZEL-GLYCERIN EX PADS: 1.0000 "application " | MEDICATED_PAD | CUTANEOUS | Status: DC | PRN

## 2016-09-09 MED ORDER — PRENATAL MULTIVITAMIN CH
1.0000 | ORAL_TABLET | Freq: Every day | ORAL | Status: DC
  Administered 2016-09-09 – 2016-09-11 (×3): 1 via ORAL
  Filled 2016-09-09 (×3): qty 1

## 2016-09-09 MED ORDER — OXYCODONE-ACETAMINOPHEN 5-325 MG PO TABS
2.0000 | ORAL_TABLET | ORAL | Status: DC | PRN
  Administered 2016-09-09 – 2016-09-11 (×5): 2 via ORAL
  Filled 2016-09-09 (×5): qty 2

## 2016-09-09 MED ORDER — FLEET ENEMA 7-19 GM/118ML RE ENEM: 1.0000 | ENEMA | RECTAL | Status: DC | PRN

## 2016-09-09 MED ORDER — SIMETHICONE 80 MG PO CHEW: 80.0000 mg | CHEWABLE_TABLET | ORAL | Status: DC | PRN

## 2016-09-09 MED ORDER — OXYTOCIN 10 UNIT/ML IJ SOLN
10.0000 [IU] | Freq: Once | INTRAMUSCULAR | Status: AC
  Administered 2016-09-09: 10 [IU] via INTRAMUSCULAR

## 2016-09-09 MED ORDER — TETANUS-DIPHTH-ACELL PERTUSSIS 5-2.5-18.5 LF-MCG/0.5 IM SUSP
0.5000 mL | Freq: Once | INTRAMUSCULAR | Status: AC
  Administered 2016-09-10: 0.5 mL via INTRAMUSCULAR
  Filled 2016-09-09: qty 0.5

## 2016-09-09 NOTE — H&P (Signed)
LABOR AND DELIVERY ADMISSION HISTORY AND PHYSICAL NOTE  Emma CivatteCharnelle D Ryan is a 25 y.o. female [phone] with no prenatal care who present in active labor. SVE complete in triage. She denies leakage of fluid.  Prenatal History/Complications: no prenatal care. LMP in November 2016  Past Medical History: Past Medical History:  Diagnosis Date  . Infection 08/13   Chlamydia and Gonorrhea- was treated    Past Surgical History: Past Surgical History:  Procedure Laterality Date  . NO PAST SURGERIES      Obstetrical History: OB History    Gravida Para Term Preterm AB Living   6 5 4  0 0 5   SAB TAB Ectopic Multiple Live Births   0 0 0 0 3      Social History: Social History   Social History  . Marital status: Single    Spouse name: N/A  . Number of children: N/A  . Years of education: N/A   Social History Main Topics  . Smoking status: Former Games developermoker  . Smokeless tobacco: Never Used     Comment: quit prior to preg  . Alcohol use No  . Drug use: No  . Sexual activity: Not Currently   Other Topics Concern  . Not on file   Social History Narrative  . No narrative on file    Family History: Family History  Problem Relation Age of Onset  . Hypertension Maternal Aunt   . Diabetes Paternal Grandfather   . Hearing loss Neg Hx     Allergies: No Known Allergies  Prescriptions Prior to Admission  Medication Sig Dispense Refill Last Dose  . acetaminophen (TYLENOL) 325 MG tablet Take 2 tablets (650 mg total) by mouth every 4 (four) hours as needed (for pain scale < 4). 90 tablet 3   . ibuprofen (ADVIL,MOTRIN) 600 MG tablet Take 1 tablet (600 mg total) by mouth every 6 (six) hours. 30 tablet 0      Review of Systems  All systems reviewed and negative except as stated in HPI  Last menstrual period 11/24/2015, unknown if currently breastfeeding. General appearance: In pain with contractions Lungs: clear to auscultation bilaterally Heart: regular rate and  rhythm Abdomen: soft, non-tender; bowel sounds normal Extremities: No calf swelling or tenderness Presentation: cephalic  Dilation: Lip/rim Effacement (%): 100 Station: -2 Exam by:: Remigio EisenmengerBenji Stanley RN  Prenatal labs: no prenatal care ABO, Rh:  A+ Antibody:  neg (08/24/15) Rubella: immune  Prenatal Transfer Tool  Maternal Diabetes: No Genetic Screening: n/a Maternal Ultrasounds/Referrals: n/a Fetal Ultrasounds or other Referrals:  n/ Maternal Substance Abuse:  declines Significant Maternal Medications:  none Significant Maternal Lab Results: n/a  No results found for this or any previous visit (from the past 24 hour(s)).  Patient Active Problem List   Diagnosis Date Noted  . Labor and delivery, indication for care 09/09/2016  . Active labor at term 09/09/2016  . No prenatal care in current pregnancy 08/24/2015  . NSVD (normal spontaneous vaginal delivery) 07/02/2014    Assessment: Emma NicolasCharnelle D Switala is a 25 y.o. [phone] with no prenatal care, who presents in active labor  #labor: Admit to birthing suites for imminent delivery #Pain: n/a #ID:  GBS unknown  #MOF: bottle #MOC:wants nexplanon  Emma NicolasJulie P Ryan 09/09/2016, 6:13 AM   CNM attestation:  I have seen and examined this patient; I agree with above documentation in the resident's note.   Emma CivatteCharnelle D Ryan is a 25 y.o. G6P5006 at unverified gestation other than LMP (41.3wks), here for active  labor/transition  PE: BP (!) 143/96 (BP Location: Left Arm)   Pulse 63   Temp 98.2 F (36.8 C) (Axillary)   Resp 18   Ht 5\' 5"  (1.651 m)   Wt 92.1 kg (203 lb)   LMP 11/24/2015   Breastfeeding? Unknown   BMI 33.78 kg/m   Resp: normal effort, no distress Abd: gravid  ROS, labs, PMH reviewed  Plan: Admit to [facility] for delivery SW consult- no prenatal care x at least last 3 preg UDS ordered  Cam Hai CNM 09/09/2016, 9:19 AM

## 2016-09-09 NOTE — MAU Note (Signed)
Having pressure.  No prenatal care. 6th baby. No leaking. No bleeding. Think baby is term.

## 2016-09-10 LAB — HEPATITIS B SURFACE ANTIGEN: Hepatitis B Surface Ag: NEGATIVE

## 2016-09-10 LAB — RPR: RPR Ser Ql: NONREACTIVE

## 2016-09-10 LAB — RUBELLA SCREEN: Rubella: 4.97 index (ref >0.99)

## 2016-09-10 NOTE — Clinical Social Work Maternal (Signed)
CLINICAL SOCIAL WORK MATERNAL/CHILD NOTE  Patient Details  Name: Emma Ryan MRN: [id number] Date of Birth: September 10, 1991  Date:  09/10/2016  Clinical Social Worker Initiating Note:  Ferdinand Lango Raidon Swanner, MSW, LCSW-A Date/ Time Initiated:  09/10/16/1227     Child's Name:  Emma Ryan    Legal Guardian:  Mother   Need for Interpreter:  None   Date of Referral:  09/09/16     Reason for Referral:  Late or No Prenatal Care    Referral Source:  Physician   Address:  [address], [place] [address]  Phone number:  [phone]   Household Members:  Self, Minor Children   Natural Supports (not living in the home):  Immediate Family   Professional Supports:     Employment:     Type of Work:     Education:      Museum/gallery curator Resources:  [employer]   Other Resources:  Physicist, medical , [facility]   Cultural/Religious Considerations Which May Impact Care:  None reported.   Strengths:  Ability to meet basic needs , Home prepared for child    Risk Factors/Current Problems:  None   Cognitive State:  Alert    Mood/Affect:  Comfortable    CSW Assessment: CSW met with MOB at bedside. This Probation officer explained role and reasoning for consult being because she did not have [place]. MOB did not note a reasoning for this other than "just didn't have it". This Probation officer acknowledged that this is baby number 6 and inquired whether or not she was interested in any contraceptive. MOB states she would like not begin birth control medication that she can put in her arm. MOB further notes she told her MD and they're checking to see if she can get it here or the health department. This Probation officer assessed MOB preporation for baby at home. MOB notes babys car seat is with her mom and she is bringing it to the hospital tomorrow. MOB further notes she will be buying a pack-n-play for baby to sleep in. This Probation officer discussed PPD and SIDS. MOB verbalizes understanding. At this time, no further needs were  addressed or requested. Case closed to this CSW.   CSW Plan/Description:  No Further Intervention Required/No Barriers to Discharge    Oda Cogan, MSW, [facility]  Office: [phone]

## 2016-09-10 NOTE — Progress Notes (Signed)
POSTPARTUM PROGRESS NOTE  Post Partum Day 1  Subjective:  Emma Ryan is a 25 y.o. [id number] 6392w3d s/p NSVD.  No acute events overnight.  Pt denies problems with ambulating, voiding or po intake.  She denies nausea or vomiting.  Pain is well controlled.  S Lochia Small.   Objective: Blood pressure (!) 144/60, pulse 69, temperature 98.5 F (36.9 C), temperature source Oral, resp. rate 16, height 5\' 5"  (1.651 m), weight 203 lb (92.1 kg), last menstrual period 11/24/2015, unknown if currently breastfeeding.  Physical Exam:  General: alert, cooperative and no distress Lochia:normal flow Chest: no respiratory distress Heart:regular rate, distal pulses intact Abdomen: soft, nontender,  Uterine Fundus: firm, appropriately tender DVT Evaluation: No calf swelling or tenderness Extremities: no edema   Recent Labs  09/09/16 1442  HGB 11.1*  HCT 32.4*    Assessment/Plan:  ASSESSMENT: Emma Ryan is a 25 y.o. [id number] 392w3d s/p NSVD. Had not PNC. SW consult pending.   Plan for discharge tomorrow   LOS: 1 day   Emma NicolasJulie P DegeleMD 09/10/2016, 7:13 AM

## 2016-09-11 ENCOUNTER — Telehealth: Payer: Self-pay | Admitting: Women's Health

## 2016-09-11 LAB — GC/CHLAMYDIA PROBE AMP (~~LOC~~) NOT AT ARMC
CHLAMYDIA, DNA PROBE: POSITIVE — AB
NEISSERIA GONORRHEA: NEGATIVE

## 2016-09-11 LAB — SICKLE CELL SCREEN: SICKLE CELL SCREEN: NEGATIVE

## 2016-09-11 MED ORDER — MEDROXYPROGESTERONE ACETATE 150 MG/ML IM SUSP
150.0000 mg | Freq: Once | INTRAMUSCULAR | Status: AC
  Administered 2016-09-11: 150 mg via INTRAMUSCULAR
  Filled 2016-09-11: qty 1

## 2016-09-11 MED ORDER — IBUPROFEN 600 MG PO TABS: 600.0000 mg | ORAL_TABLET | Freq: Four times a day (QID) | ORAL | 0 refills | Status: DC

## 2016-09-11 NOTE — Discharge Summary (Signed)
       OB Discharge Summary  Patient Name: Emma Ryan DOB: Dec 09, 1991 MRN: [id number]  Date of admission: 09/09/2016 Delivering MD: Frederik PearEGELE, JULIE P   Date of discharge: 09/11/2016  Admitting diagnosis: came in stating had to push Intrauterine pregnancy: 2369w3d     Secondary diagnosis:Active Problems:   Labor and delivery, indication for care   Active labor at term  Additional problems:none     Discharge diagnosis: Term Pregnancy Delivered                                                                     Post partum procedures:none  Augmentation: none  Complications: None  Hospital course:  Onset of Labor With Vaginal Delivery     25 y.o. yo G6P5006 at 4869w3d was admitted in Active Labor on 09/09/2016. Patient had an uncomplicated labor course as follows:  Membrane Rupture Time/Date: 5:51 AM ,09/09/2016   Intrapartum Procedures: Episiotomy: None [1]                                         Lacerations:  None [1]  Patient had a delivery of a Viable infant. 09/09/2016  Information for the patient's newborn:  Roxanna MewWashington, Boy Kimberlynn [[id number]][[id number]]  Delivery Method: Vaginal, Spontaneous Delivery (Filed from Delivery Summary)    Pateint had an uncomplicated postpartum course.  She is ambulating, tolerating a regular diet, passing flatus, and urinating well. Patient is discharged home in stable condition on 09/11/16.    Physical exam Vitals:   09/09/16 1300 09/09/16 1805 09/10/16 0607 09/10/16 1900  BP: 139/69 139/85 (!) 144/60 137/73  Pulse: 61 75 69 96  Resp: 18 18 16 18   Temp: 98.5 F (36.9 C) 97.8 F (36.6 C) 98.5 F (36.9 C) 98 F (36.7 C)  TempSrc: Oral Oral Oral Oral  SpO2:    96%  Weight:      Height:       General: alert, cooperative and no distress Lochia: appropriate Uterine Fundus: firm Incision: N/A DVT Evaluation: No evidence of DVT seen on physical exam. Labs: Lab Results  Component Value Date   WBC 12.3 (H) 09/09/2016   HGB 11.1 (L)  09/09/2016   HCT 32.4 (L) 09/09/2016   MCV 85.5 09/09/2016   PLT 232 09/09/2016   No flowsheet data found.  Discharge instruction: per After Visit Summary and "Baby and Me Booklet".  After Visit Meds:    Medication List    TAKE these medications   ibuprofen 600 MG tablet Commonly known as:  ADVIL,MOTRIN Take 1 tablet (600 mg total) by mouth every 6 (six) hours.       Diet: routine diet  Activity: Advance as tolerated. Pelvic rest for 6 weeks.   Outpatient follow up:6 weeks Follow up Appt:No future appointments. Follow up visit: No Follow-up on file.  Postpartum contraception: Depo Provera  Newborn Data: Live born female  Birth Weight: 8 lb 10 oz (3912 g) APGAR: 8, 9  Baby Feeding: Bottle Disposition:home with mother   09/11/2016 Wyvonnia DuskyMarie Lawson, CNM

## 2016-09-11 NOTE — Progress Notes (Signed)
Post discharge chart review completed.  

## 2016-09-12 NOTE — Telephone Encounter (Signed)
LM for pt to return call, need to notify of +CT.  Cheral MarkerKimberly R. Haifa Hatton, CNM, [facility] 09/12/2016 4:49 PM

## 2016-09-14 ENCOUNTER — Telehealth: Payer: Self-pay | Admitting: *Deleted

## 2016-09-14 ENCOUNTER — Other Ambulatory Visit: Payer: Self-pay | Admitting: *Deleted

## 2016-09-14 DIAGNOSIS — A749 Chlamydial infection, unspecified: Secondary | ICD-10-CM

## 2016-09-14 MED ORDER — AZITHROMYCIN 500 MG PO TABS: ORAL_TABLET | ORAL | 0 refills | Status: DC

## 2016-09-14 NOTE — Telephone Encounter (Signed)
Will rx azithromycin 500 mg #2 take 2 po now

## 2016-09-14 NOTE — Telephone Encounter (Signed)
Pt informed of +CHL, will need to get partners name and dob for tx or he can go to health dept. Pt informed no sex until poc x 3 weeks can go to [facility] for urine for POC, ordered placed in EPIC. Pt verbalized understanding.

## 2016-09-14 NOTE — Telephone Encounter (Signed)
Left message x 1. JSY 

## 2016-09-18 ENCOUNTER — Encounter: Payer: Self-pay | Admitting: Women's Health

## 2016-09-18 DIAGNOSIS — A749 Chlamydial infection, unspecified: Secondary | ICD-10-CM | POA: Insufficient documentation

## 2019-12-26 NOTE — L&D Delivery Note (Signed)
LABOR COURSE Patient was transferred from MAU in active labor and determined to be 9/100/+2 shortly after her arrival. She SROMd with initiation of pushing and did not require augmentation.  Delivery Note Called to room and patient was complete and pushing. Head delivered ROT. Loose nuchal cord present, delivered through. Shoulder and body delivered in usual fashion. At [phone] a viable female was delivered via Vaginal, Spontaneous (Presentation:ROT; ROA ).  Infant with spontaneous cry, placed on mother's abdomen, dried and stimulated. Cord clamped x 2 after one-minute delay, and cut by patient. Cord pale and pulseless prior to cutting. Cord blood drawn. Placenta delivered spontaneously with gentle cord traction. Appears intact. Fundus firm with massage and Pitocin. Labia, perineum, vagina, and cervix inspected with no lacerations noted.   APGAR:9, 9; weight: 3260g .   Cord: 3VC with the following complications:none.   Cord pH: Collection not indicated  Anesthesia: None Episiotomy: None Lacerations: None Q. Blood Loss (mL): 50  Elevated blood pressures during second stage with no history of Hypertension. PEC labs normal.   Mom to postpartum.  Baby to Couplet care / Skin to Skin.  Clayton Bibles, CNM 02/05/20 11:28 AM

## 2020-01-26 DIAGNOSIS — Z3200 Encounter for pregnancy test, result unknown: Secondary | ICD-10-CM | POA: Diagnosis not present

## 2020-01-26 DIAGNOSIS — Z3009 Encounter for other general counseling and advice on contraception: Secondary | ICD-10-CM | POA: Diagnosis not present

## 2020-01-27 ENCOUNTER — Other Ambulatory Visit: Payer: Self-pay

## 2020-01-27 ENCOUNTER — Inpatient Hospital Stay (HOSPITAL_BASED_OUTPATIENT_CLINIC_OR_DEPARTMENT_OTHER): Payer: Medicaid Other

## 2020-01-27 ENCOUNTER — Encounter (HOSPITAL_COMMUNITY): Payer: Self-pay | Admitting: Obstetrics and Gynecology

## 2020-01-27 ENCOUNTER — Inpatient Hospital Stay (HOSPITAL_COMMUNITY)
Admission: AD | Admit: 2020-01-27 | Discharge: 2020-01-27 | Disposition: A | Discharge status: Home / Self Care | Payer: Medicaid Other | Attending: Obstetrics and Gynecology | Admitting: Obstetrics and Gynecology

## 2020-01-27 DIAGNOSIS — O0930 Supervision of pregnancy with insufficient antenatal care, unspecified trimester: Secondary | ICD-10-CM

## 2020-01-27 DIAGNOSIS — Z3687 Encounter for antenatal screening for uncertain dates: Secondary | ICD-10-CM | POA: Diagnosis not present

## 2020-01-27 DIAGNOSIS — O0933 Supervision of pregnancy with insufficient antenatal care, third trimester: Secondary | ICD-10-CM | POA: Diagnosis not present

## 2020-01-27 DIAGNOSIS — Z3A37 37 weeks gestation of pregnancy: Secondary | ICD-10-CM

## 2020-01-27 DIAGNOSIS — Z3A38 38 weeks gestation of pregnancy: Secondary | ICD-10-CM

## 2020-01-27 LAB — URINALYSIS, ROUTINE W REFLEX MICROSCOPIC
Bilirubin Urine: NEGATIVE
Glucose, UA: NEGATIVE mg/dL
Hgb urine dipstick: NEGATIVE
Ketones, ur: NEGATIVE mg/dL
Nitrite: NEGATIVE
Protein, ur: 30 mg/dL — AB
Specific Gravity, Urine: 1.036 — ABNORMAL HIGH (ref 1.005–1.030)
pH: 5 (ref 5.0–8.0)

## 2020-01-27 LAB — CBC
HCT: 35.4 % — ABNORMAL LOW (ref 36.0–46.0)
Hemoglobin: 11.6 g/dL — ABNORMAL LOW (ref 12.0–15.0)
MCH: 28.9 pg (ref 26.0–34.0)
MCHC: 32.8 g/dL (ref 30.0–36.0)
MCV: 88.1 fL (ref 80.0–100.0)
Platelets: 310 10*3/uL (ref 150–400)
RBC: 4.02 MIL/uL (ref 3.87–5.11)
RDW: 14.1 % (ref 11.5–15.5)
WBC: 10.1 10*3/uL (ref 4.0–10.5)
nRBC: 0 % (ref 0.0–0.2)

## 2020-01-27 LAB — HIV ANTIBODY (ROUTINE TESTING W REFLEX): HIV Screen 4th Generation wRfx: NONREACTIVE

## 2020-01-27 LAB — HEPATITIS B SURFACE ANTIGEN: Hepatitis B Surface Ag: NONREACTIVE

## 2020-01-27 NOTE — MAU Provider Note (Signed)
History     CSN: [id number]  Arrival date and time: 01/27/20 1656   First Provider Initiated Contact with Patient 01/27/20 1814      Chief Complaint  Patient presents with  . Ultrasound   HPI  Ms.  Emma Ryan is a 29 y.o. year old 537P5006 female at 4151w2d weeks gestation who [facility] Dr. Vergie LivingPickens for an ultrasound. She reports that she knew she was pregnant, but [facility] because [facility]. She states her LMP was sometime in May. She reports she "now is getting herself together and has a home." She denies VB or LOF. She reports good (+) FM.    Past Medical History:  Diagnosis Date  . Infection 08/13   Chlamydia and Gonorrhea- was treated    Past Surgical History:  Procedure Laterality Date  . NO PAST SURGERIES      Family History  Problem Relation Age of Onset  . Hypertension Maternal Aunt   . Diabetes Paternal Grandfather   . Hearing loss Neg Hx     Social History   Tobacco Use  . Smoking status: Former Games developermoker  . Smokeless tobacco: Never Used  . Tobacco comment: quit prior to preg  Substance Use Topics  . Alcohol use: No  . Drug use: No    Allergies: No Known Allergies  Medications Prior to Admission  Medication Sig Dispense Refill Last Dose  . azithromycin (ZITHROMAX) 500 MG tablet Take 2 po now 2 tablet 0   . ibuprofen (ADVIL,MOTRIN) 600 MG tablet Take 1 tablet (600 mg total) by mouth every 6 (six) hours. 30 tablet 0     Review of Systems  Constitutional: Negative.   HENT: Negative.   Eyes: Negative.   Respiratory: Negative.   Cardiovascular: Negative.   Gastrointestinal: Negative.   Endocrine: Negative.   Genitourinary: Negative.        (+) FM  Musculoskeletal: Negative.   Skin: Negative.   Allergic/Immunologic: Negative.   Neurological: Negative.   Hematological: Negative.   Psychiatric/Behavioral: Negative.    Physical Exam   Blood pressure (!) 124/55, pulse 78, temperature 98.7 F  (37.1 C), temperature source Oral, resp. rate 20, height 5\' 9"  (1.753 m), weight 115.5 kg, last menstrual period 05/04/2019, SpO2 100 %, unknown if currently breastfeeding.  Physical Exam  Nursing note and vitals reviewed. Constitutional: She is oriented to person, place, and time. She appears well-developed and well-nourished.  HENT:  Head: Normocephalic and atraumatic.  Eyes: Pupils are equal, round, and reactive to light.  Cardiovascular: Normal rate.  Respiratory: Effort normal.  GI: Soft.  Genitourinary:    Genitourinary Comments: FH = 39 cm   Musculoskeletal:        General: Normal range of motion.     Cervical back: Normal range of motion.  Neurological: She is alert and oriented to person, place, and time. She has normal reflexes.  Skin: Skin is warm and dry.  Psychiatric: She has a normal mood and affect. Her behavior is normal. Judgment and thought content normal.   *Consult with Dr. Adrian BlackwaterStinson @ [phone] - notified of patient's complaints, assessments, lab & U/S results, tx plan Ryan/c home, [facility] - ok to Ryan/c home, agrees with plan   MAU Course  Procedures  MDM CCUA CEFM Prenatal Labs OB MFM Limited U/S  Results for orders placed or performed during the hospital encounter of 01/27/20 (from the past 72 hour(s))  Urinalysis, Routine w reflex microscopic     Status: Abnormal   Collection Time: 01/27/20  6:21 PM  Result Value Ref Range   Color, Urine AMBER (A) YELLOW    Comment: BIOCHEMICALS MAY BE AFFECTED BY COLOR   APPearance HAZY (A) CLEAR   Specific Gravity, Urine 1.036 (H) 1.005 - 1.030   pH 5.0 5.0 - 8.0   Glucose, UA NEGATIVE NEGATIVE mg/dL   Hgb urine dipstick NEGATIVE NEGATIVE   Bilirubin Urine NEGATIVE NEGATIVE   Ketones, ur NEGATIVE NEGATIVE mg/dL   Protein, ur 30 (A) NEGATIVE mg/dL   Nitrite NEGATIVE NEGATIVE   Leukocytes,Ua MODERATE (A) NEGATIVE   RBC / HPF 0-5 0 - 5 RBC/hpf   WBC, UA 6-10 0 - 5 WBC/hpf   Bacteria, UA RARE (A) NONE  SEEN   Squamous Epithelial / LPF 0-5 0 - 5   Mucus PRESENT     Comment: Performed at [facility], [address], [place], [place] [address]  CBC     Status: Abnormal   Collection Time: 01/27/20  7:21 PM  Result Value Ref Range   WBC 10.1 4.0 - 10.5 K/uL   RBC 4.02 3.87 - 5.11 MIL/uL   Hemoglobin 11.6 (L) 12.0 - 15.0 g/dL   HCT 60.4 (L) 54.0 - 98.1 %   MCV 88.1 80.0 - 100.0 fL   MCH 28.9 26.0 - 34.0 pg   MCHC 32.8 30.0 - 36.0 g/dL   RDW 19.1 47.8 - 29.5 %   Platelets 310 150 - 400 K/uL   nRBC 0.0 0.0 - 0.2 %    Comment: Performed at [facility], [address], [place], [place] [address]  RPR     Status: Abnormal   Collection Time: 01/27/20  7:21 PM  Result Value Ref Range   RPR Ser Ql Reactive (A) NON REACTIVE    Comment: SENT FOR CONFIRMATION   RPR Titer 1:4     Comment: Performed at [facility], [address] [place], [place] [address]  HIV Antibody (routine testing w rflx)     Status: None   Collection Time: 01/27/20  7:21 PM  Result Value Ref Range   HIV Screen 4th Generation wRfx NON REACTIVE NON REACTIVE    Comment: Performed at [facility], [address] [place], [place] [address]  Rubella screen     Status: None   Collection Time: 01/27/20  7:21 PM  Result Value Ref Range   Rubella 7.10 Immune >0.99 index    Comment: (NOTE)                                Non-immune       <0.90                                Equivocal  0.90 - 0.99                                Immune           >0.99 Performed At: [facility] [address] [place], [place] [id number] Jolene Schimke MD [id number]   Hepatitis B surface antigen     Status: None   Collection Time: 01/27/20  7:21 PM  Result Value Ref Range   Hepatitis B  Surface Ag NON REACTIVE NON REACTIVE    Comment: Performed at [facility], [address], [place], [place] [address]  T.pallidum Ab, Total     Status: Abnormal   Collection Time: 01/27/20  7:21 PM  Result Value  Ref Range   T Pallidum Abs Reactive (A) Non Reactive    Comment: (NOTE) Performed At: [facility] [address] [place], [place] [id number] Jolene Schimke MD [id number]   Culture, beta strep (group b only)     Status: Abnormal   Collection Time: 01/27/20  7:32 PM   Specimen: Vaginal/Rectal; Genital  Result Value Ref Range   Specimen Description VAGINAL/RECTAL    Special Requests      NONE Performed at [facility], [address] [place], [place] [address]    Culture GROUP B STREP(S.AGALACTIAE)ISOLATED (A)    Report Status 01/29/2020 FINAL    [place] MFM OB LIMITED  Result Date: 01/28/2020 ----------------------------------------------------------------------  OBSTETRICS REPORT                       (Signed Final 01/28/2020 09:16 am) ---------------------------------------------------------------------- Patient Info  ID #:       [id number]                          Ryan.O.B.:  Jun 22, 1991 (28 yrs)  Name:       Emma Ryan                     Visit Date: 01/27/2020 06:49 pm              Scherzer ---------------------------------------------------------------------- Performed By  Performed By:     Lenise Arena        Ref. Address:     [address]                                                             [place], [place]                                                             [address]  Attending:        Ma Rings MD         [facility]  [place]  Referred By:      Laury Deep CNM ---------------------------------------------------------------------- Orders   #  Description                          Code         Ordered By   1  [place] MFM OB LIMITED                    93235.57     DUKGURK Sherif Millspaugh  ----------------------------------------------------------------------   #  Order #                     Accession #                 Episode #   1  [id number]                  [id number]                  [id number]  ---------------------------------------------------------------------- Indications   Determine Fetal presentation by ultrasound     Z36.89   Encounter for uncertain dates                  Z36.87   [redacted] weeks gestation of pregnancy                Z3A.37   Late to prenatal care, third trimester (NO     O09.33   PNC)  ---------------------------------------------------------------------- Fetal Evaluation  Num Of Fetuses:         1  Fetal Heart Rate(bpm):  141  Cardiac Activity:       Observed  Presentation:           Cephalic  Placenta:               Anterior  Amniotic Fluid  AFI FV:      Within normal limits  AFI Sum(cm)     %Tile       Largest Pocket(cm)  18.18           70          6.24  RUQ(cm)       RLQ(cm)       LUQ(cm)        LLQ(cm)  6.24          2.91          5.35           3.68  Comment:    No placental abruption or previa identified. ---------------------------------------------------------------------- Biometry  BPD:      89.1  mm     G. Age:  36w 0d         35  %                                                          FL/BPD:     84.0   %    71 - 87  FL:       74.8  mm     G. Age:  38w 2d         13  % ---------------------------------------------------------------------- Gestational Age  U/S Today:     37w 1d                                        EDD:   02/16/20  Best:          37w 1d     Det. By:  U/S (01/27/20)           EDD:   02/16/20 ---------------------------------------------------------------------- Anatomy  Thoracic:              Appears normal         Bladder:                Appears normal  Stomach:               Appears normal ---------------------------------------------------------------------- Cervix Uterus Adnexa  Cervix  Not visualized (advanced GA >24wks) ---------------------------------------------------------------------- Comments  A limited ultrasound performed today  shows that the fetus  was in the vertex presentation.  There was normal amniotic fluid noted. ----------------------------------------------------------------------                   Ma Rings, MD Electronically Signed Final Report   01/28/2020 09:16 am ----------------------------------------------------------------------   Assessment and Plan  No prenatal care in current pregnancy in third trimester  - Reassurance given that baby is measuring 37.1 wks on today's U/S - Msg sent to [facility] sent to get patient a OB appt - Discharge home - Patient verbalized an understanding of the plan of care and agrees.    Raelyn Mora, MSN, CNM 01/27/2020, 6:14 PM

## 2020-01-27 NOTE — Discharge Instructions (Signed)
We have done your prenatal labs and Group Beta Strep test. If your GBS is positive, you will need to receive antibiotics while in labor. If it is negative, you will not have to receive antibiotics in labor; unless another infection arises. Someone will call you to give you those results or those results will be discussed with you at your next prenatal visit.

## 2020-01-27 NOTE — MAU Note (Signed)
Pt sent from [facility] for ultrasound to determine gestational age secondary no PNC with pregnancy.  Denies VB or LOF.  Reports +FM.

## 2020-01-28 LAB — RPR
RPR Ser Ql: REACTIVE — AB
RPR Titer: 1:4 {titer}

## 2020-01-29 LAB — CULTURE, BETA STREP (GROUP B ONLY)

## 2020-01-29 LAB — T.PALLIDUM AB, TOTAL: T Pallidum Abs: REACTIVE — AB

## 2020-01-29 LAB — RUBELLA SCREEN: Rubella: 7.1 index (ref >0.99)

## 2020-01-30 ENCOUNTER — Telehealth: Payer: Self-pay | Admitting: Advanced Practice Midwife

## 2020-01-30 NOTE — Telephone Encounter (Signed)
Called patient and verified name, DOB and address. Verbal consent given to review results over the phone. Reviewed with patient test + for syphilis. Phone number [phone]) to the health department to call for treatment. Questions answered.   Results for orders placed or performed during the hospital encounter of 01/27/20 (from the past 72 hour(s))  Urinalysis, Routine w reflex microscopic     Status: Abnormal   Collection Time: 01/27/20  6:21 PM  Result Value Ref Range   Color, Urine AMBER (A) YELLOW    Comment: BIOCHEMICALS MAY BE AFFECTED BY COLOR   APPearance HAZY (A) CLEAR   Specific Gravity, Urine 1.036 (H) 1.005 - 1.030   pH 5.0 5.0 - 8.0   Glucose, UA NEGATIVE NEGATIVE mg/dL   Hgb urine dipstick NEGATIVE NEGATIVE   Bilirubin Urine NEGATIVE NEGATIVE   Ketones, ur NEGATIVE NEGATIVE mg/dL   Protein, ur 30 (A) NEGATIVE mg/dL   Nitrite NEGATIVE NEGATIVE   Leukocytes,Ua MODERATE (A) NEGATIVE   RBC / HPF 0-5 0 - 5 RBC/hpf   WBC, UA 6-10 0 - 5 WBC/hpf   Bacteria, UA RARE (A) NONE SEEN   Squamous Epithelial / LPF 0-5 0 - 5   Mucus PRESENT     Comment: Performed at [facility], [address], [place], [place] [address]  CBC     Status: Abnormal   Collection Time: 01/27/20  7:21 PM  Result Value Ref Range   WBC 10.1 4.0 - 10.5 K/uL   RBC 4.02 3.87 - 5.11 MIL/uL   Hemoglobin 11.6 (L) 12.0 - 15.0 g/dL   HCT 36.6 (L) 44.0 - 34.7 %   MCV 88.1 80.0 - 100.0 fL   MCH 28.9 26.0 - 34.0 pg   MCHC 32.8 30.0 - 36.0 g/dL   RDW 42.5 95.6 - 38.7 %   Platelets 310 150 - 400 K/uL   nRBC 0.0 0.0 - 0.2 %    Comment: Performed at [facility], [address], [place], [place] [address]  RPR     Status: Abnormal   Collection Time: 01/27/20  7:21 PM  Result Value Ref Range   RPR Ser Ql Reactive (A) NON REACTIVE    Comment: SENT FOR CONFIRMATION   RPR Titer 1:4     Comment: Performed at [facility], [address], [place], [place] [address]  HIV Antibody (routine testing  w rflx)     Status: None   Collection Time: 01/27/20  7:21 PM  Result Value Ref Range   HIV Screen 4th Generation wRfx NON REACTIVE NON REACTIVE    Comment: Performed at [facility], [address], [place], [place] [address]  Rubella screen     Status: None   Collection Time: 01/27/20  7:21 PM  Result Value Ref Range   Rubella 7.10 Immune >0.99 index    Comment: (NOTE)                                Non-immune       <0.90                                Equivocal  0.90 - 0.99  Immune           >0.99 Performed At: [facility] [place], [place] [id number] Rush Farmer MD PQ:[id number]   Hepatitis B surface antigen     Status: None   Collection Time: 01/27/20  7:21 PM  Result Value Ref Range   Hepatitis B Surface Ag NON REACTIVE NON REACTIVE    Comment: Performed at [facility] [address], [place], [place] [address]  T.pallidum Ab, Total     Status: Abnormal   Collection Time: 01/27/20  7:21 PM  Result Value Ref Range   T Pallidum Abs Reactive (A) Non Reactive    Comment: (NOTE) Performed At: [facility] [address] [place], [place] [id number] Rush Farmer MD DS:[id number]   Culture, beta strep (group b only)     Status: Abnormal   Collection Time: 01/27/20  7:32 PM   Specimen: Vaginal/Rectal; Genital  Result Value Ref Range   Specimen Description VAGINAL/RECTAL    Special Requests      NONE Performed at [facility], [place] [address], [place], [place] [address]    Culture GROUP B STREP(S.AGALACTIAE)ISOLATED (A)    Report Status 01/29/2020 FINAL      Marcille Buffy DNP, CNM  01/30/20  11:25 AM

## 2020-02-03 ENCOUNTER — Other Ambulatory Visit: Payer: Self-pay

## 2020-02-03 ENCOUNTER — Encounter: Payer: Self-pay | Admitting: Student

## 2020-02-03 ENCOUNTER — Ambulatory Visit (INDEPENDENT_AMBULATORY_CARE_PROVIDER_SITE_OTHER): Payer: Medicaid Other | Admitting: Student

## 2020-02-03 VITALS — BP 115/73 | HR 81 | Wt 232.0 lb

## 2020-02-03 DIAGNOSIS — O0933 Supervision of pregnancy with insufficient antenatal care, third trimester: Secondary | ICD-10-CM | POA: Diagnosis not present

## 2020-02-03 DIAGNOSIS — O0993 Supervision of high risk pregnancy, unspecified, third trimester: Secondary | ICD-10-CM | POA: Diagnosis not present

## 2020-02-03 DIAGNOSIS — B951 Streptococcus, group B, as the cause of diseases classified elsewhere: Secondary | ICD-10-CM | POA: Insufficient documentation

## 2020-02-03 DIAGNOSIS — O98113 Syphilis complicating pregnancy, third trimester: Secondary | ICD-10-CM

## 2020-02-03 DIAGNOSIS — O0990 Supervision of high risk pregnancy, unspecified, unspecified trimester: Secondary | ICD-10-CM | POA: Insufficient documentation

## 2020-02-03 LAB — GLUCOSE, CAPILLARY: Glucose-Capillary: 112 mg/dL — ABNORMAL HIGH (ref 70–99)

## 2020-02-03 NOTE — Patient Instructions (Signed)
Syphilis Syphilis is an infection that can spread through sexual contact. The infection can cause serious complications, so it is important to get treatment right away. There are four stages of syphilis:  Primary stage. During this stage sores may form where the disease entered your body.  Secondary stage. During this stage skin rashes and lesions will form.  Latent stage. During this stage there are no symptoms, but the infection may still be contagious.  Tertiary stage. This stage happens 10-30 years after the infection starts. During this stage, the disease damages organs and can lead to death. Most people do not develop this stage of syphilis. What are the causes? This condition is caused by bacteria called Treponema pallidum. The condition can spread during sexual activity, such as during oral, anal, or vaginal sex. It can also be spread from mother to fetus during pregnancy. What increases the risk? You are more likely to develop this condition if:  You do not use a condom.  You have or had another sexually transmitted infection (STI).  You have multiple sex partners.  You use illegal drugs through an IV. What are the signs or symptoms? Symptoms of this condition depend on the stage of the disease. Primary stage  Painless sores (chancres) in and around the genital organs, mouth, or hands. The sores are usually firm and round. Secondary stage  A rash or sores. The rash usually does not itch.  A fever.  A headache.  A sore throat.  Swollen lymph nodes.  New sores in the mouth or on the genitals.  A feeling of being ill.  Pain in the joints.  Patchy hair loss.  Weight loss.  Fatigue. Latent stage There are no symptoms during this stage. Tertiary stage  Dementia.  Personality and mood changes.  Difficulty walking and coordinating movements.  Muscle weakness or paralysis.  Problems with coordination.  Heart failure.  Trouble  breathing.  Fainting.  Soft, rubbery growths on the skin, bones, or liver (gummas).  Sudden "lightning" pains, numbness, or tingling.  Vision changes.  Hearing changes.  Trouble controlling your urine and bowel movements. How is this diagnosed? This condition is diagnosed with:  A physical exam.  Blood tests.  Tests of the fluid (drainage) from a sore or rash.  Tests of the fluid around the spine (lumbar puncture). These tests are done to check for an infection in the brain or nervous system.  Imaging tests. These may be done to check for damage to the heart, aorta, or brain if the condition is in the tertiary stage. Tests may include: ? An X-ray. ? A CT scan. ? An MRI. ? An echocardiogram. This test takes a picture of the heart. ? An ultrasound. How is this treated? This condition can be cured with antibiotic medicine. During the first day of treatment, the medicine may cause you to experience fever, chills, headache, nausea, or aching all over your body. This is normal and should go away within 24 hours. Follow these instructions at home: Medicines   Take over-the-counter and prescription medicines only as told by your health care provider.  Take your antibiotic medicine as told by your health care provider. Do not stop taking the antibiotic even if you start to feel better. Incomplete treatment will put you at risk for continued infection and could be life threatening. General instructions  Do not have sex until your treatment is completed, or as directed by your health care provider.  Tell your recent sexual partners that you   were diagnosed with syphilis. It is important that they get treatment, even if they do not have symptoms.  Keep all follow-up visits as told by your health care provider. This is important. How is this prevented?  Use latex condoms correctly whenever you have sex.  Before you have sex, ask your partner if he or she has been tested for STIs.  Ask about the test results.  Avoid having multiple sexual partners. Contact a health care provider if:  You continue to have any of the following symptoms 24 hours after beginning treatment: ? Fever. ? Chills. ? Headache. ? Nausea. ? Aching all over your body.  Your symptoms do not improve, even with treatment. Get help right away if:  You have severe chest pain.  You have trouble walking or coordinating movements.  You are confused.  You lose vision or hearing.  You have numbness in your arms or legs.  You have a seizure.  You faint.  You have a severe headache that does not go away with medicine. Summary  Syphilis is an infection that can spread through sexual contact.  This condition can cause serious complications, so it is best to get treatment right away. The condition can be cured with antibiotic medicine.  This condition can also be spread from mother to fetus during pregnancy.  Take your antibiotic medicine as told by your health care provider.  Tell your recent sexual partners that you were diagnosed with syphilis. It is important that they get treatment, even if they do not have symptoms. This information is not intended to replace advice given to you by your health care provider. Make sure you discuss any questions you have with your health care provider. Document Revised: 06/24/2019 Document Reviewed: 02/06/2017 Elsevier Patient Education  2020 Elsevier Inc.  

## 2020-02-03 NOTE — Progress Notes (Signed)
Subjective:   Emma Ryan is a 29 y.o. G7P5006 at [redacted]w[redacted]d by unsure LMP being seen today for her first obstetrical visit.  Her obstetrical history is significant for late to care, obesity, grand multiparity, & syphilis. Patient does not intend to breast feed. Pregnancy history fully reviewed.  Patient reports no complaints.  HISTORY: OB History  Gravida Para Term Preterm AB Living  7 6 5  0 0 6  SAB TAB Ectopic Multiple Live Births  0 0 0 0 6    # Outcome Date GA Lbr Len/2nd Weight Sex Delivery Anes PTL Lv  7 Current           6 Term 09/09/16 [redacted]w[redacted]d 00:52 / 00:01 8 lb 10 oz (3.912 kg) M Vag-Spont None  LIV     Name: Mehler,BOY Rebie     Apgar1: 8  Apgar5: 9  5 Para 08/24/15  01:00 / 00:03 7 lb 5.1 oz (3.32 kg) M Vag-Spont None  LIV     Name: Danh,BOY Junia     Apgar1: 7  Apgar5: 10  4 Term 07/02/14 [redacted]w[redacted]d  6 lb 1 oz (2.75 kg) M Vag-Spont None  LIV     Name: LINZEY, RAMSER     Apgar5: 9  3 Term 03/10/13  12:03 / 00:04 5 lb 12.1 oz (2.61 kg) M Vag-Spont None  LIV     Name: Pol,BOY Deb     Apgar1: 9  Apgar5: 9  2 Term 2012     Vag-Spont     1 Term 2010     Vag-Spont      Past Medical History:  Diagnosis Date  . Infection 08/13   Chlamydia and Gonorrhea- was treated   Past Surgical History:  Procedure Laterality Date  . NO PAST SURGERIES     Family History  Problem Relation Age of Onset  . Hypertension Maternal Aunt   . Diabetes Paternal Grandfather   . Hearing loss Neg Hx    Social History   Tobacco Use  . Smoking status: Former 9/13  . Smokeless tobacco: Never Used  . Tobacco comment: quit prior to preg  Substance Use Topics  . Alcohol use: No  . Drug use: No   No Known Allergies No current outpatient medications on file prior to visit.   No current facility-administered medications on file prior to visit.    Exam   Vitals:   02/03/20 1017  BP: 115/73  Pulse: 81  Weight: 232 lb (105.2 kg)    Fetal Heart Rate (bpm): 152  Uterus:  Fundal Height: 38 cm  System: General: well-developed, well-nourished female in no acute distress   Skin: normal coloration and turgor, no rashes   Neurologic: oriented, normal, negative, normal mood   Extremities: normal strength, tone, and muscle mass, ROM of all joints is normal   HEENT PERRLA, extraocular movement intact and sclera clear, anicteric   Mouth/Teeth mucous membranes moist, pharynx normal without lesions and dental hygiene good   Neck supple and no masses   Cardiovascular: regular rate and rhythm   Respiratory:  no respiratory distress, normal breath sounds   Abdomen: soft, non-tender; bowel sounds normal; no masses,  no organomegaly     Assessment:   Pregnancy: 04/02/20 Patient Active Problem List   Diagnosis Date Noted  . Supervision of high risk pregnancy, antepartum 02/03/2020  . 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 Positive GBS test 02/03/2020  . Chlamydia 09/18/2016  . No prenatal care  in current pregnancy 08/24/2015  . NSVD (normal spontaneous vaginal delivery) 07/02/2014     Plan:  Pregnancy: G7P5006 at [redacted]w[redacted]d 1. Supervision of high risk pregnancy in third trimester -unsure LMP & Dating. Per Dr. Donalee Citrin, not to use dating from MAU ultrasound.  -2 hr post prandial CBG in office= 112 - [place] MFM OB DETAIL +14 [place]; Future  2. Syphilis in pregnancy, antepartum, third trimester -denies hx. Does not recall any symptoms. T pal positive. Going to [facility] today to start treatment.  -F/u with MD next week & detailed anatomy ultrasound ordered per c/w Dr. Rosana Hoes & Dr. Donalee Citrin - [place] MFM OB DETAIL +14 [place]; Future  3. Late prenatal care affecting pregnancy in third trimester  - [place] MFM OB DETAIL +14 [facility]; Future  4. Positive GBS test -needs treatment in labor. No medication allergies  5. Unwanted fertility -wants BTL. Will sign 30 day papers when she comes back for her ultrasound appointment tomorrow.  Pregnancy medicaid pending.     Initial labs drawn in MAU - reviewed. 2 hr PP CBG = 112 Continue prenatal vitamins. Genetic Screening discussed, late to care: Ultrasound discussed; fetal anatomic survey: ordered. Problem list reviewed and updated. The nature of [facility] with multiple MDs and other Advanced Practice Providers was explained to patient; also emphasized that residents, students are part of our team. Routine obstetric precautions reviewed. Return in about 1 week (around 02/10/2020) for High Risk OB, in person with MD.   Jorje Guild 11:42 AM 02/03/20

## 2020-02-03 NOTE — Progress Notes (Signed)
Will stop by health department today, states called but no answer. 6 Living babies baby Number 5 is entered as "Para" but was term.

## 2020-02-04 ENCOUNTER — Encounter (HOSPITAL_COMMUNITY): Payer: Self-pay

## 2020-02-04 ENCOUNTER — Ambulatory Visit (HOSPITAL_COMMUNITY): Payer: Medicaid Other | Admitting: *Deleted

## 2020-02-04 ENCOUNTER — Ambulatory Visit (HOSPITAL_BASED_OUTPATIENT_CLINIC_OR_DEPARTMENT_OTHER)
Admission: RE | Admit: 2020-02-04 | Discharge: 2020-02-04 | Disposition: A | Discharge status: Home / Self Care | Payer: Medicaid Other | Source: Ambulatory Visit | Attending: Student | Admitting: Student

## 2020-02-04 DIAGNOSIS — O0933 Supervision of pregnancy with insufficient antenatal care, third trimester: Secondary | ICD-10-CM | POA: Insufficient documentation

## 2020-02-04 DIAGNOSIS — O0943 Supervision of pregnancy with grand multiparity, third trimester: Secondary | ICD-10-CM

## 2020-02-04 DIAGNOSIS — O0990 Supervision of high risk pregnancy, unspecified, unspecified trimester: Secondary | ICD-10-CM | POA: Insufficient documentation

## 2020-02-04 DIAGNOSIS — B951 Streptococcus, group B, as the cause of diseases classified elsewhere: Secondary | ICD-10-CM | POA: Insufficient documentation

## 2020-02-04 DIAGNOSIS — Z3A37 37 weeks gestation of pregnancy: Secondary | ICD-10-CM

## 2020-02-04 DIAGNOSIS — O99213 Obesity complicating pregnancy, third trimester: Secondary | ICD-10-CM

## 2020-02-04 DIAGNOSIS — O0993 Supervision of high risk pregnancy, unspecified, third trimester: Secondary | ICD-10-CM

## 2020-02-04 DIAGNOSIS — O98113 Syphilis complicating pregnancy, third trimester: Secondary | ICD-10-CM | POA: Insufficient documentation

## 2020-02-04 DIAGNOSIS — O2693 Pregnancy related conditions, unspecified, third trimester: Secondary | ICD-10-CM

## 2020-02-05 ENCOUNTER — Inpatient Hospital Stay (HOSPITAL_COMMUNITY)
Admission: AD | Admit: 2020-02-05 | Discharge: 2020-02-07 | DRG: 807 | Disposition: A | Discharge status: Home / Self Care | Payer: Medicaid Other | Attending: Obstetrics and Gynecology | Admitting: Obstetrics and Gynecology

## 2020-02-05 ENCOUNTER — Encounter (HOSPITAL_COMMUNITY): Payer: Self-pay | Admitting: Obstetrics and Gynecology

## 2020-02-05 ENCOUNTER — Other Ambulatory Visit: Payer: Self-pay

## 2020-02-05 DIAGNOSIS — O98113 Syphilis complicating pregnancy, third trimester: Secondary | ICD-10-CM | POA: Diagnosis present

## 2020-02-05 DIAGNOSIS — Z20822 Contact with and (suspected) exposure to covid-19: Secondary | ICD-10-CM | POA: Diagnosis not present

## 2020-02-05 DIAGNOSIS — R030 Elevated blood-pressure reading, without diagnosis of hypertension: Secondary | ICD-10-CM | POA: Diagnosis not present

## 2020-02-05 DIAGNOSIS — O9812 Syphilis complicating childbirth: Principal | ICD-10-CM | POA: Diagnosis present

## 2020-02-05 DIAGNOSIS — O99824 Streptococcus B carrier state complicating childbirth: Secondary | ICD-10-CM | POA: Diagnosis present

## 2020-02-05 DIAGNOSIS — A519 Early syphilis, unspecified: Secondary | ICD-10-CM | POA: Diagnosis not present

## 2020-02-05 DIAGNOSIS — A510 Primary genital syphilis: Secondary | ICD-10-CM | POA: Diagnosis present

## 2020-02-05 DIAGNOSIS — Z3A37 37 weeks gestation of pregnancy: Secondary | ICD-10-CM | POA: Diagnosis not present

## 2020-02-05 DIAGNOSIS — Z87891 Personal history of nicotine dependence: Secondary | ICD-10-CM

## 2020-02-05 DIAGNOSIS — O411230 Chorioamnionitis, third trimester, not applicable or unspecified: Secondary | ICD-10-CM | POA: Diagnosis not present

## 2020-02-05 DIAGNOSIS — O26893 Other specified pregnancy related conditions, third trimester: Secondary | ICD-10-CM | POA: Diagnosis not present

## 2020-02-05 DIAGNOSIS — B951 Streptococcus, group B, as the cause of diseases classified elsewhere: Secondary | ICD-10-CM

## 2020-02-05 DIAGNOSIS — O0990 Supervision of high risk pregnancy, unspecified, unspecified trimester: Secondary | ICD-10-CM

## 2020-02-05 DIAGNOSIS — O0930 Supervision of pregnancy with insufficient antenatal care, unspecified trimester: Secondary | ICD-10-CM

## 2020-02-05 LAB — CBC
HCT: 36.5 % (ref 36.0–46.0)
Hemoglobin: 11.9 g/dL — ABNORMAL LOW (ref 12.0–15.0)
MCH: 28.6 pg (ref 26.0–34.0)
MCHC: 32.6 g/dL (ref 30.0–36.0)
MCV: 87.7 fL (ref 80.0–100.0)
Platelets: 274 10*3/uL (ref 150–400)
RBC: 4.16 MIL/uL (ref 3.87–5.11)
RDW: 14.2 % (ref 11.5–15.5)
WBC: 11.5 10*3/uL — ABNORMAL HIGH (ref 4.0–10.5)
nRBC: 0 % (ref 0.0–0.2)

## 2020-02-05 LAB — ABO/RH: ABO/RH(D): A POS

## 2020-02-05 LAB — PROTEIN / CREATININE RATIO, URINE
Creatinine, Urine: 117.87 mg/dL
Protein Creatinine Ratio: 0.08 mg/mg{Cre} (ref 0.00–0.15)
Total Protein, Urine: 9 mg/dL

## 2020-02-05 LAB — COMPREHENSIVE METABOLIC PANEL
ALT: 21 U/L (ref 0–44)
AST: 22 U/L (ref 15–41)
Albumin: 2.8 g/dL — ABNORMAL LOW (ref 3.5–5.0)
Alkaline Phosphatase: 174 U/L — ABNORMAL HIGH (ref 38–126)
Anion gap: 7 (ref 5–15)
BUN: 9 mg/dL (ref 6–20)
CO2: 20 mmol/L — ABNORMAL LOW (ref 22–32)
Calcium: 8.9 mg/dL (ref 8.9–10.3)
Chloride: 108 mmol/L (ref 98–111)
Creatinine, Ser: 0.74 mg/dL (ref 0.44–1.00)
GFR calc Af Amer: >60 mL/min (ref >60)
GFR calc non Af Amer: >60 mL/min (ref >60)
Glucose, Bld: 108 mg/dL — ABNORMAL HIGH (ref 70–99)
Potassium: 3.9 mmol/L (ref 3.5–5.1)
Sodium: 135 mmol/L (ref 135–145)
Total Bilirubin: 0.3 mg/dL (ref 0.3–1.2)
Total Protein: 6.5 g/dL (ref 6.5–8.1)

## 2020-02-05 LAB — TYPE AND SCREEN
ABO/RH(D): A POS
Antibody Screen: NEGATIVE

## 2020-02-05 LAB — RESPIRATORY PANEL BY RT PCR (FLU A&B, COVID)
Influenza A by PCR: NEGATIVE
Influenza B by PCR: NEGATIVE
SARS Coronavirus 2 by RT PCR: NEGATIVE

## 2020-02-05 MED ORDER — LACTATED RINGERS IV SOLN: 500.0000 mL | INTRAVENOUS | Status: DC | PRN

## 2020-02-05 MED ORDER — LIDOCAINE HCL (PF) 1 % IJ SOLN: 30.0000 mL | INTRAMUSCULAR | Status: DC | PRN

## 2020-02-05 MED ORDER — FERROUS SULFATE 325 (65 FE) MG PO TABS
325.0000 mg | ORAL_TABLET | Freq: Two times a day (BID) | ORAL | Status: DC
  Administered 2020-02-05 – 2020-02-07 (×4): 325 mg via ORAL
  Filled 2020-02-05 (×4): qty 1

## 2020-02-05 MED ORDER — OXYCODONE-ACETAMINOPHEN 5-325 MG PO TABS: 1.0000 | ORAL_TABLET | ORAL | Status: DC | PRN

## 2020-02-05 MED ORDER — SIMETHICONE 80 MG PO CHEW: 80.0000 mg | CHEWABLE_TABLET | ORAL | Status: DC | PRN

## 2020-02-05 MED ORDER — OXYTOCIN 40 UNITS IN NORMAL SALINE INFUSION - SIMPLE MED
2.5000 [IU]/h | INTRAVENOUS | Status: DC
  Administered 2020-02-05: 2.5 [IU]/h via INTRAVENOUS
  Filled 2020-02-05: qty 1000

## 2020-02-05 MED ORDER — IBUPROFEN 600 MG PO TABS
600.0000 mg | ORAL_TABLET | Freq: Four times a day (QID) | ORAL | Status: DC
  Administered 2020-02-05 – 2020-02-07 (×8): 600 mg via ORAL
  Filled 2020-02-05 (×7): qty 1

## 2020-02-05 MED ORDER — TETANUS-DIPHTH-ACELL PERTUSSIS 5-2.5-18.5 LF-MCG/0.5 IM SUSP
0.5000 mL | Freq: Once | INTRAMUSCULAR | Status: AC
  Administered 2020-02-07: 0.5 mL via INTRAMUSCULAR
  Filled 2020-02-05: qty 0.5

## 2020-02-05 MED ORDER — ACETAMINOPHEN 325 MG PO TABS
650.0000 mg | ORAL_TABLET | ORAL | Status: DC | PRN
  Administered 2020-02-05: 650 mg via ORAL
  Filled 2020-02-05: qty 2

## 2020-02-05 MED ORDER — ACETAMINOPHEN 325 MG PO TABS
650.0000 mg | ORAL_TABLET | ORAL | Status: DC | PRN
  Administered 2020-02-05 – 2020-02-06 (×5): 650 mg via ORAL
  Filled 2020-02-05 (×5): qty 2

## 2020-02-05 MED ORDER — MAGNESIUM HYDROXIDE 400 MG/5ML PO SUSP: 30.0000 mL | ORAL | Status: DC | PRN

## 2020-02-05 MED ORDER — OXYTOCIN BOLUS FROM INFUSION
500.0000 mL | Freq: Once | INTRAVENOUS | Status: AC
  Administered 2020-02-05: 500 mL via INTRAVENOUS

## 2020-02-05 MED ORDER — MEASLES, MUMPS & RUBELLA VAC IJ SOLR: 0.5000 mL | Freq: Once | INTRAMUSCULAR | Status: DC

## 2020-02-05 MED ORDER — WITCH HAZEL-GLYCERIN EX PADS: 1.0000 "application " | MEDICATED_PAD | CUTANEOUS | Status: DC | PRN

## 2020-02-05 MED ORDER — ONDANSETRON HCL 4 MG PO TABS: 4.0000 mg | ORAL_TABLET | ORAL | Status: DC | PRN

## 2020-02-05 MED ORDER — PENICILLIN G BENZATHINE 1200000 UNIT/2ML IM SUSP
2.4000 10*6.[IU] | Freq: Once | INTRAMUSCULAR | Status: AC
  Administered 2020-02-05: 2.4 10*6.[IU] via INTRAMUSCULAR
  Filled 2020-02-05 (×2): qty 4

## 2020-02-05 MED ORDER — DIPHENHYDRAMINE HCL 25 MG PO CAPS: 25.0000 mg | ORAL_CAPSULE | Freq: Four times a day (QID) | ORAL | Status: DC | PRN

## 2020-02-05 MED ORDER — PRENATAL MULTIVITAMIN CH
1.0000 | ORAL_TABLET | Freq: Every day | ORAL | Status: DC
  Administered 2020-02-05 – 2020-02-06 (×2): 1 via ORAL
  Filled 2020-02-05: qty 1

## 2020-02-05 MED ORDER — FLEET ENEMA 7-19 GM/118ML RE ENEM: 1.0000 | ENEMA | RECTAL | Status: DC | PRN

## 2020-02-05 MED ORDER — LACTATED RINGERS IV SOLN: INTRAVENOUS | Status: DC

## 2020-02-05 MED ORDER — ONDANSETRON HCL 4 MG/2ML IJ SOLN: 4.0000 mg | Freq: Four times a day (QID) | INTRAMUSCULAR | Status: DC | PRN

## 2020-02-05 MED ORDER — COCONUT OIL OIL: 1.0000 "application " | TOPICAL_OIL | Status: DC | PRN

## 2020-02-05 MED ORDER — DIBUCAINE (PERIANAL) 1 % EX OINT: 1.0000 "application " | TOPICAL_OINTMENT | CUTANEOUS | Status: DC | PRN

## 2020-02-05 MED ORDER — SOD CITRATE-CITRIC ACID 500-334 MG/5ML PO SOLN: 30.0000 mL | ORAL | Status: DC | PRN

## 2020-02-05 MED ORDER — ONDANSETRON HCL 4 MG/2ML IJ SOLN: 4.0000 mg | INTRAMUSCULAR | Status: DC | PRN

## 2020-02-05 MED ORDER — OXYCODONE-ACETAMINOPHEN 5-325 MG PO TABS: 2.0000 | ORAL_TABLET | ORAL | Status: DC | PRN

## 2020-02-05 MED ORDER — SODIUM CHLORIDE 0.9 % IV SOLN
2.0000 g | Freq: Once | INTRAVENOUS | Status: DC
  Filled 2020-02-05: qty 2000

## 2020-02-05 MED ORDER — BENZOCAINE-MENTHOL 20-0.5 % EX AERO: 1.0000 "application " | INHALATION_SPRAY | CUTANEOUS | Status: DC | PRN

## 2020-02-05 NOTE — MAU Note (Signed)
covid swab collected

## 2020-02-05 NOTE — Discharge Summary (Signed)
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 yo [id number] admitted in Active Labor on 02/05/2020. Patient had an uncomplicated labor course as follows:  Membrane Rupture Time/Date: 9:47 AM ,02/05/2020   Intrapartum Procedures: Episiotomy: None [1]                                         Lacerations:  None [1]  Patient had a delivery of a Viable infant. 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 Received Depo for birth control; desires interval tubal and appt requested. BP's well-controlled without medication. Infant in NICU on day of discharge as baby is getting ruled out for congenital syphilis. SW following. She is ambulating, tolerating a regular diet, passing flatus, and urinating well. Patient is discharged home in stable condition on 02/07/20.  Delivery time: 9:47 AM    Magnesium Sulfate received: No BMZ received:  No Rhophylac:No MMR:No Transfusion:No  Physical exam  Vitals:   02/06/20 1653 02/06/20 2030 02/06/20 2030 02/07/20 0525  BP: (!) 111/55 130/65 130/65 115/68  Pulse: 66 74 74 67  Resp: 18 18 18 16   Temp: 98.4 F (36.9 C) (!) 97.5 F (36.4 C) (!) 97.5 F (36.4 C) 97.8 F (36.6 C)  TempSrc: Oral Axillary Oral Oral  SpO2:   100% 100%  Weight:      Height:       General: alert, cooperative and no distress Lochia: appropriate Uterine Fundus: firm Incision: N/A DVT Evaluation: No evidence of DVT seen on physical exam. Labs: Lab Results  Component Value Date   WBC 11.5 (H) 02/05/2020   HGB 11.9 (L) 02/05/2020   HCT 36.5 02/05/2020   MCV 87.7 02/05/2020   PLT 274 02/05/2020   CMP Latest Ref Rng & Units 02/05/2020  Glucose 70 - 99 mg/dL 108(H)  BUN 6 - 20 mg/dL 9  Creatinine 0.44 - 1.00 mg/dL 0.74  Sodium 135 - 145 mmol/L 135  Potassium 3.5 - 5.1 mmol/L 3.9  Chloride 98 - 111 mmol/L 108  CO2 22 - 32 mmol/L 20(L)  Calcium 8.9 - 10.3 mg/dL 8.9  Total Protein 6.5 - 8.1 g/dL 6.5  Total Bilirubin 0.3 - 1.2 mg/dL 0.3  Alkaline Phos 38 - 126 U/L 174(H)  AST 15 - 41 U/L 22  ALT 0 - 44 U/L 21   Edinburgh Score: Edinburgh Postnatal Depression Scale Screening Tool 02/05/2020  I have been able to laugh and see the funny side of things. 0  I have looked forward with enjoyment to things. 0  I have blamed myself unnecessarily when things went wrong. 2  I have been anxious or worried for no good reason. 2  I have felt scared or panicky for no good reason. 0  Things have been getting on top of me. 1  I have been so unhappy that I have had difficulty sleeping. 0  I have felt sad or miserable. 1  I have been so unhappy that I have been crying. 0  The thought of harming myself has occurred to me. 0  Edinburgh Postnatal Depression Scale Total 6    Discharge instruction: per After Visit Summary and "Baby and Me Booklet".  After visit meds:  Allergies as of 02/07/2020   No Known  Allergies     Medication List    TAKE these medications   acetaminophen 325 MG tablet Commonly known as: Tylenol Take 2 tablets (650 mg total) by mouth every 6 (six) hours as needed (for pain scale < 4).   ferrous sulfate 325 (65 FE) MG tablet Take 1 tablet (325 mg total) by mouth every other day.   ibuprofen 600 MG tablet Commonly known as: ADVIL Take 1 tablet (600 mg total) by mouth every 6 (six) hours.   PrePLUS 27-1 MG Tabs Take 1 tablet by mouth daily.       Diet: routine diet  Activity: Advance as tolerated. Pelvic rest for 6 weeks.   Outpatient follow up:4 weeks Follow up Appt: Future Appointments  Date Time Provider [place]  02/13/2020  8:30 AM Hun[place]ell Clancy  03/10/2020  3:55 PM Aletha Halim, MD Rafael Hernandez  03/19/2020 10:15 AM Hillard Danker, Myles Rosenthal, PA-C [facility]   Follow up Visit (message sent by Maryelizabeth Kaufmann, CNM on 02/05/2020):  Please schedule this patient for Postpartum visit in: 4 weeks with the following provider: Any provider For C/S patients schedule nurse incision check in weeks 2 weeks: no High risk pregnancy complicated by: late prenatal care, syphilis Delivery mode: SVD Anticipated Birth Control: Depo PP Procedures needed: BP check  Schedule Integrated BH visit: no   Newborn Data: Live born female  Birth Weight: 3260g  APGAR (1 MIN): 9   APGAR (5 MINS): 9   APGAR (10 MINS):    Newborn Delivery   Birth date/time: 02/05/2020 09:47:00 Delivery type:       Baby Feeding: Bottle Disposition:NICU

## 2020-02-05 NOTE — MAU Note (Signed)
Presents with c/o ctxs since last night, hasn't been timing them.  Denies LOF or VB.  Reports +FM.

## 2020-02-05 NOTE — Progress Notes (Signed)
EFM applied @ 0926, FHR 130's.  Pt transferred to L&D via stretcher secondary actively laboring, cervix 7-8cms and grand multiparous.

## 2020-02-05 NOTE — H&P (Signed)
Emma Ryan is a 29 y.o. female presenting for SOL. She endorses recurrent painful contractions since last night. Pregnancy complications include late prenatal care and new diagnosis of primary syphilis. She denies vaginal bleeding, leaking of fluid, decreased fetal movement, fever, falls, or recent illness.    OB History    Gravida  7   Para  6   Term  5   Preterm  0   AB  0   Living  6     SAB  0   TAB  0   Ectopic  0   Multiple  0   Live Births  6          Past Medical History:  Diagnosis Date  . Infection 08/13   Chlamydia and Gonorrhea- was treated   Past Surgical History:  Procedure Laterality Date  . NO PAST SURGERIES     Family History: family history includes Diabetes in her paternal grandfather; Hypertension in her maternal aunt. Social History:  reports that she has quit smoking. She has never used smokeless tobacco. She reports that she does not drink alcohol or use drugs.    Maternal Diabetes: No Genetic Screening: Declined (limited prenatal care) Maternal Ultrasounds/Referrals: Normal Fetal Ultrasounds or other Referrals:  None Maternal Substance Abuse:  No Significant Maternal Medications:  None Significant Maternal Lab Results:  Group B Strep positive and Other: RPR and T. Pal positive, no treatment Other Comments:  None  Review of Systems  Gastrointestinal: Positive for abdominal pain.  Genitourinary: Negative for vaginal bleeding, vaginal discharge and vaginal pain.  Musculoskeletal: Positive for back pain.  All other systems reviewed and are negative.  Dilation: 10 Effacement (%): 100 Station: 0 Exam by:: moyer rn Blood pressure 131/67, pulse 71, temperature 98.9 F (37.2 C), temperature source Oral, resp. rate 18, height 5\' 9"  (1.753 m), weight 116.2 kg, last menstrual period 05/04/2019, SpO2 100 %, unknown if currently breastfeeding. Physical Exam  Nursing note and vitals reviewed. Constitutional: She is oriented to  person, place, and time. She appears well-developed.  Cardiovascular: Normal rate.  Respiratory: Effort normal and breath sounds normal.  GI:  Gravid  Neurological: She is alert and oriented to person, place, and time.  Skin: Skin is warm and dry.  Psychiatric: She has a normal mood and affect. Her behavior is normal. Judgment and thought content normal.    Prenatal labs: ABO, Rh:  A POS Antibody:   Rubella: 7.10 (02/02 1921) RPR: Reactive (02/02 1921)  HBsAg: NON REACTIVE (02/02 1921)  HIV: NON REACTIVE (02/02 1921)  GBS:   POS  Assessment/Plan: --29 y.o. G7P5006 at [redacted]w[redacted]d admitted for SOL at term --Limited prenatal care: MAU visit x 1 visit 01/27/2020,  [facility] x 1 visit 02/03/2020  Labor: --9/100/+2 with strong urge to push shortly after arrival on unit --GBS POS: Ampicillin ordered  Syphilis: --New diagnosis 01/27/2020 --Patient previously scheduled to initiate treatment at [facility] today --Treatment ordered for initiation on postpartum  Postpartum Planning: --Rubella immune --Inpatient social work consult --Planning interval tubal ligation, desires depo short term  [facility], CNM 02/05/2020, 10:35 AM

## 2020-02-05 NOTE — Discharge Instructions (Signed)

## 2020-02-06 LAB — GC/CHLAMYDIA PROBE AMP (~~LOC~~) NOT AT ARMC
Chlamydia: NEGATIVE
Comment: NEGATIVE
Comment: NORMAL
Neisseria Gonorrhea: NEGATIVE

## 2020-02-06 LAB — RPR
RPR Ser Ql: REACTIVE — AB
RPR Titer: 1:4 {titer}

## 2020-02-06 MED ORDER — MEDROXYPROGESTERONE ACETATE 150 MG/ML IM SUSP
150.0000 mg | Freq: Once | INTRAMUSCULAR | Status: AC
  Administered 2020-02-06: 150 mg via INTRAMUSCULAR
  Filled 2020-02-06: qty 1

## 2020-02-06 NOTE — Progress Notes (Addendum)
Post Partum Day 1 Subjective: Patient reports feeling well. She is tolerating PO. Ambulating and urinating without difficulty. Lochia minimal. Patient would like to stay through Day 2.   Objective: Blood pressure (!) 119/59, pulse 63, temperature 98.3 F (36.8 C), temperature source Oral, resp. rate 18, height 5\' 9"  (1.753 m), weight 116.2 kg, last menstrual period 05/04/2019, SpO2 97 %, unknown if currently breastfeeding.  Physical Exam:  General: alert, cooperative, appears stated age and no distress Lochia: appropriate Uterine Fundus: firm Incision: NA DVT Evaluation: No evidence of DVT seen on physical exam.  Recent Labs    02/05/20 0934  HGB 11.9*  HCT 36.5    Assessment/Plan: Plan for discharge tomorrow; desires DC tomorrow Vitals stable; patient with elevated BP's in second stage with negative labs; BP's stable currently Diagnosed with primary syphilis with admission labs; already received PCN GC/Chlamydia testing not done since 2017; ordered and nurse informed of collection HIV neg Desires Depo then interval tubal; Depo ordered and message sent to scheduling to arrange BTL Bottle Feeding  SW consult pending    LOS: 1 day   2018 02/06/2020, 5:00 AM

## 2020-02-06 NOTE — MAU Note (Signed)
G/C swab collected without complications.

## 2020-02-06 NOTE — Clinical Social Work Maternal (Signed)
CLINICAL SOCIAL WORK MATERNAL/CHILD NOTE  Patient Details  Name: Emma Ryan MRN: [id number] Date of Birth: 1991/03/04  Date:  02/06/2020  Clinical Social Worker Initiating Note:  Abundio Miu, [place] Date/Time: Initiated:  02/06/20/1213     Child's Name:  Darlyn Read   Biological Parents:  Mother, Father(Father: Clotilde Dieter)   Need for Interpreter:  None   Reason for Referral:  Late or No Prenatal Care    Address:  [address] [place] [address]    Phone number:  [phone] (home)     Additional phone number:   Household Members/Support Persons (HM/SP):   Household Member/Support Person 1, Household Member/Support Person 2, Household Member/Support Person 3, Household Member/Support Person 4, Household Member/Support Person 5, Household Member/Support Person 6   HM/SP Name Relationship DOB or Age  HM/SP -81 DeZyre daugher 30 years old  HM/SP -2 Avion Enzlow son 06/22/11  HM/SP -3 Devin [place] son 03/10/13  HM/SP -4 A'keem Geary son 07/02/14  HM/SP -5 DeMarcus Saxton son 08/24/15  HM/SP -6 Aidyn [place] son 09/09/16  HM/SP -7        HM/SP -8          Natural Supports (not living in the home):  Parent   Professional Supports: None   Employment: Unemployed   Type of Work:     Education:  Programmer, systems   Homebound arranged:    Museum/gallery curator Resources:  Medicaid   Other Resources:  Physicist, medical , [place] Considerations Which May Impact Care:    Strengths:  Ability to meet basic needs , Home prepared for child , Understanding of illness, Pediatrician chosen   Psychotropic Medications:         Pediatrician:    [place] area  Pediatrician List:   [facility] ([address])  [place]      Pediatrician Fax Number:    Risk Factors/Current Problems:  None   Cognitive  State:  Able to Concentrate , Alert , Goal Oriented , Linear Thinking    Mood/Affect:  Interested , Calm    CSW Assessment: CSW met with MOB at infant's bedside to discuss consult for late/limited prenatal care. MOB was sitting recliner and holding infant. CSW introduced self and explained reason for consult. MOB was welcoming, pleasant and engaged during assessment. MOB reported that she resides with her older six children. MOB reported that she is unemployed and receives both [facility] and food stamps. MOB reported that she has all items needed to care for infant including a basinet. MOB reported that FOB is supposed to bring a car seat and if he does not she should be able to get a car seat. CSW inquired about MOB's support system, MOB reported that her mom and FOB are supports.   CSW inquired about MOB's mental health history, MOB denied any mental health history. MOB denied any history of postpartum depression. CSW inquired about how MOB was feeling emotionally after giving birth, MOB reported that she is feeling "pretty good". MOB presented calm and did not demonstrate any acute mental health signs/symptoms. CSW assessed for safety, MOB denied SI, HI and domestic violence.   CSW provided education regarding the baby blues period vs. perinatal mood disorders, discussed treatment and gave resources for mental health follow up if concerns arise.  CSW recommends self-evaluation during the  postpartum time period using the New Mom Checklist from Postpartum Progress and encouraged MOB to contact a medical professional if symptoms are noted at any time.    CSW provided review of Sudden Infant Death Syndrome (SIDS) precautions.    CSW and MOB discussed infant's NICU admission. MOB reported that it has been pretty good so far and she feels well informed about infant's care. CSW informed MOB about the NICU, what to expect and resources/supports available while infant is admitted to the NICU. CSW inquired  about transportation barriers, MOB reported that childcare will be a barrier to visitation. CSW acknowledged and normalized childcare being a barrier and offered to video chat with MOB if she is interested so she can see infant. MOB denied any questions/concerns regarding the NICU.   CSW informed about the hospital drug screen policy due to limited/late prenatal care. MOB confirmed that she only had one prenatal visit due to being homeless and living in a hotel during her pregnancy. MOB reported that she didn't want to leave her children alone in the hotel. MOB reported that she has been in her new apartment for 3 weeks and does not foresee any barriers with getting infant to follow up appointments. CSW inquired about any substance use during pregnancy, MOB denied any substance use during pregnancy. CSW informed MOB that infant's UDS was negative and CDS would continue to be monitored and a CPS report would be made if warranted. MOB reported that she had an open CPS case in September 2018 that was closed in November 2018. MOB reported that she is unsure why the case was open but she reported that it was closed after she moved due to CPS having concerns about her housing. MOB reported no current open cases with CPS.   CSW will continue to offer resources/supports while infant is admitted to the NICU.    CSW Plan/Description:  Sudden Infant Death Syndrome (SIDS) Education, Perinatal Mood and Anxiety Disorder (PMADs) Education, [place], CSW Will Continue to Monitor Umbilical Cord Tissue Drug Screen Results and Make Report if Barbette Or, LCSW 02/06/2020, 12:37 PM

## 2020-02-07 MED ORDER — IBUPROFEN 600 MG PO TABS: 600.0000 mg | ORAL_TABLET | Freq: Four times a day (QID) | ORAL | 0 refills | Status: DC

## 2020-02-07 MED ORDER — PREPLUS 27-1 MG PO TABS: 1.0000 | ORAL_TABLET | Freq: Every day | ORAL | 13 refills | Status: DC

## 2020-02-07 MED ORDER — ACETAMINOPHEN 325 MG PO TABS: 650.0000 mg | ORAL_TABLET | Freq: Four times a day (QID) | ORAL | 0 refills | Status: DC | PRN

## 2020-02-07 MED ORDER — FERROUS SULFATE 325 (65 FE) MG PO TABS: 325.0000 mg | ORAL_TABLET | ORAL | 0 refills | Status: DC

## 2020-02-09 LAB — SURGICAL PATHOLOGY

## 2020-02-09 LAB — T.PALLIDUM AB, TOTAL: T Pallidum Abs: REACTIVE — AB

## 2020-02-12 ENCOUNTER — Encounter: Payer: Self-pay | Admitting: Obstetrics & Gynecology

## 2020-02-13 ENCOUNTER — Other Ambulatory Visit: Payer: Self-pay

## 2020-02-13 ENCOUNTER — Telehealth (INDEPENDENT_AMBULATORY_CARE_PROVIDER_SITE_OTHER): Payer: Medicaid Other

## 2020-02-13 DIAGNOSIS — Z0130 Encounter for examination of blood pressure without abnormal findings: Secondary | ICD-10-CM

## 2020-02-13 NOTE — Progress Notes (Signed)
Called pt for virtual BP check. Pt is 1w 1d s/p vaginal delivery. Pt had elevated BP during delivery with no history of hypertension Pt does not have BP cuff with her at this time. Pt denies any s/s of hypertension; not currently taking BP meds. Reviewed pt hx with Alvester Morin, MD who states pt may be scheduled for in person BP check on Monday unless she is able to check BP at home before that time. Notified pt that she may call us with BP value on Monday if she is able to check at home or come into the office for BP check. Pt verbalizes understanding.   Fleet Contras RN 02/13/20

## 2020-02-13 NOTE — Progress Notes (Signed)
Attestation of Attending Supervision of clinical support staff: I agree with the care provided to this patient and was available for any consultation.  I have reviewed the RN's note and chart. I was available for consult and to see the patient if needed.   Hailey Stormer Niles Rakia Frayne, MD, MPH, ABFM Attending Physician Faculty Practice- [facility]  

## 2020-02-27 NOTE — Progress Notes (Signed)
Attestation of Attending Supervision of clinical support staff: I agree with the care provided to this patient and was available for any consultation.  I have reviewed the RN's note and chart. I was available for consult and to see the patient if needed.   Weylin Plagge Niles Vraj Denardo, MD, MPH, ABFM Attending Physician Faculty Practice- [facility]  

## 2020-03-10 ENCOUNTER — Ambulatory Visit: Payer: Medicaid Other | Admitting: Obstetrics and Gynecology

## 2020-03-10 ENCOUNTER — Encounter: Payer: Self-pay | Admitting: Obstetrics and Gynecology

## 2020-03-11 NOTE — Progress Notes (Signed)
Patient did not keep her postpartum appointment for 03/10/2020.  Cornelia Copa MD Attending [facility] [employer] [facility])

## 2020-03-19 ENCOUNTER — Encounter (INDEPENDENT_AMBULATORY_CARE_PROVIDER_SITE_OTHER): Payer: Medicaid Other | Admitting: Medical

## 2020-03-19 ENCOUNTER — Encounter: Payer: Self-pay | Admitting: Medical

## 2020-03-19 DIAGNOSIS — Z5329 Procedure and treatment not carried out because of patient's decision for other reasons: Secondary | ICD-10-CM

## 2020-03-19 NOTE — Patient Instructions (Signed)
Please call to reschedule your missed appointment

## 2020-03-19 NOTE — Progress Notes (Signed)
Called pt at 1015; VM left stating I am calling to check pt in for virtual appt and will call back in 15 minutes. Pt encouraged to call if available before that time. Callback number given.   Called pt at [phone]; VM left stating pt will need to reschedule with front office as this is our second attempt to reach pt.   Fleet Contras RN 03/19/20

## 2020-03-30 ENCOUNTER — Telehealth (INDEPENDENT_AMBULATORY_CARE_PROVIDER_SITE_OTHER): Payer: Self-pay

## 2020-03-30 ENCOUNTER — Telehealth: Payer: Self-pay

## 2020-03-30 DIAGNOSIS — Z5329 Procedure and treatment not carried out because of patient's decision for other reasons: Secondary | ICD-10-CM

## 2020-03-30 DIAGNOSIS — O0990 Supervision of high risk pregnancy, unspecified, unspecified trimester: Secondary | ICD-10-CM

## 2020-03-30 NOTE — Telephone Encounter (Signed)
Attempted to contact patient to get her rescheduled for her missed postpartum appointment. No answer, left voicemail with new appointment (4/7@1 :35 - Virtual). Patient instructed that it is a virtual appointment so she does not have to come to the office. Instructed to give the office a call back if she is needing to reschedule.

## 2020-03-30 NOTE — Progress Notes (Signed)
Called pt at 1043; VM left stating I am calling to check pt in for virtual appt and will call back in approx. 10 minutes. Callback number given.   Called pt at [phone]; VM left stating pt will need to reschedule appt. Callback number given.   Fleet Contras RN  03/30/20

## 2020-03-31 ENCOUNTER — Other Ambulatory Visit: Payer: Self-pay

## 2020-03-31 ENCOUNTER — Encounter: Payer: Self-pay | Admitting: Family Medicine

## 2020-03-31 ENCOUNTER — Telehealth (INDEPENDENT_AMBULATORY_CARE_PROVIDER_SITE_OTHER): Payer: Medicaid Other | Admitting: Family Medicine

## 2020-03-31 NOTE — Patient Instructions (Signed)

## 2020-03-31 NOTE — Progress Notes (Signed)
     I connected with Emma Ryan on 03/31/20 at  1:35 PM EDT by: telephone and verified that I am speaking with the correct person using two identifiers.  Patient is located at home and provider is located at [place].     The purpose of this virtual visit is to provide medical care while limiting exposure to the novel coronavirus. I discussed the limitations, risks, security and privacy concerns of performing an evaluation and management service by telephone and the availability of in person appointments. I also discussed with the patient that there may be a patient responsible charge related to this service. By engaging in this virtual visit, you consent to the provision of healthcare.  Additionally, you authorize for your insurance to be billed for the services provided during this visit.  The patient expressed understanding and agreed to proceed.  The following staff members participated in the virtual visit: Maxwell Marion, RN  Post Partum Visit Note Subjective:   Emma Ryan is a 29 y.o. [phone] female being evaluated for postpartum followup.  She is 7 weeks 6 days postpartum following a normal spontaneous vaginal delivery at  37.3 gestational weeks.  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  Postpartum course has been unremarkable. Baby is doing well. Baby is feeding by bottle - Enfamil. Bleeding red. Bowel function is normal. Bladder function is normal. Patient is not sexually active. Contraception method is Depo-Provera injections. Postpartum depression screening: negative.  The following portions of the patient's history were reviewed and updated as appropriate: allergies, current medications, past family history, past medical history, past social history, past surgical history and problem list.  Review of Systems Pertinent items are noted in HPI.   Objective:  There  were no vitals filed for this visit. Self-Obtained       Assessment:    Normal postpartum exam.  Plan:  Essential components of care per ACOG recommendations:  1.  Mood and well being: Patient with negative depression screening today. Reviewed local resources for support.  - Patient does not use tobacco.  - hx of drug use? No   2. Infant care and feeding:  -Patient currently breastmilk feeding? No  -Social determinants of health (SDOH) reviewed in EPIC. No concerns  3. Sexuality, contraception and birth spacing - Patient does not want a pregnancy in the next year.  Desired family size is 7 children.  - Reviewed forms of contraception in tiered fashion. Patient desired Depo-Provera, but would like consult with OBGYN attending for BTS as she desires to not have anymore kids  4. Sleep and fatigue -Encouraged family/partner/community support of 4 hrs of uninterrupted sleep to help with mood and fatigue  5. Physical Recovery  - Discussed patients delivery and complications - Patient had no vaginal or perineal lacerations - Patient has urinary incontinence? No Patient was referred to pelvic floor PT  - Patient is safe to resume physical and sexual activity  6.  Health Maintenance - Last pap smear done greater than 5 years ago, will schedule  6 minutes of non-face-to-face time spent with the patient    Marlowe Alt, DO [facility] [employer], [facility]

## 2020-04-16 ENCOUNTER — Ambulatory Visit: Payer: Medicaid Other | Admitting: Obstetrics and Gynecology

## 2020-06-24 DIAGNOSIS — Z419 Encounter for procedure for purposes other than remedying health state, unspecified: Secondary | ICD-10-CM | POA: Diagnosis not present

## 2020-07-25 DIAGNOSIS — Z419 Encounter for procedure for purposes other than remedying health state, unspecified: Secondary | ICD-10-CM | POA: Diagnosis not present

## 2020-08-25 DIAGNOSIS — Z419 Encounter for procedure for purposes other than remedying health state, unspecified: Secondary | ICD-10-CM | POA: Diagnosis not present

## 2020-09-24 DIAGNOSIS — Z419 Encounter for procedure for purposes other than remedying health state, unspecified: Secondary | ICD-10-CM | POA: Diagnosis not present

## 2020-10-25 DIAGNOSIS — Z419 Encounter for procedure for purposes other than remedying health state, unspecified: Secondary | ICD-10-CM | POA: Diagnosis not present

## 2020-11-24 DIAGNOSIS — Z419 Encounter for procedure for purposes other than remedying health state, unspecified: Secondary | ICD-10-CM | POA: Diagnosis not present

## 2020-12-25 DIAGNOSIS — Z419 Encounter for procedure for purposes other than remedying health state, unspecified: Secondary | ICD-10-CM | POA: Diagnosis not present

## 2021-01-25 DIAGNOSIS — Z419 Encounter for procedure for purposes other than remedying health state, unspecified: Secondary | ICD-10-CM | POA: Diagnosis not present

## 2021-02-22 DIAGNOSIS — Z419 Encounter for procedure for purposes other than remedying health state, unspecified: Secondary | ICD-10-CM | POA: Diagnosis not present

## 2021-03-25 DIAGNOSIS — Z419 Encounter for procedure for purposes other than remedying health state, unspecified: Secondary | ICD-10-CM | POA: Diagnosis not present

## 2021-04-24 DIAGNOSIS — Z419 Encounter for procedure for purposes other than remedying health state, unspecified: Secondary | ICD-10-CM | POA: Diagnosis not present

## 2021-05-25 DIAGNOSIS — Z419 Encounter for procedure for purposes other than remedying health state, unspecified: Secondary | ICD-10-CM | POA: Diagnosis not present

## 2021-06-24 DIAGNOSIS — Z419 Encounter for procedure for purposes other than remedying health state, unspecified: Secondary | ICD-10-CM | POA: Diagnosis not present

## 2021-07-25 DIAGNOSIS — Z419 Encounter for procedure for purposes other than remedying health state, unspecified: Secondary | ICD-10-CM | POA: Diagnosis not present

## 2021-08-25 DIAGNOSIS — Z419 Encounter for procedure for purposes other than remedying health state, unspecified: Secondary | ICD-10-CM | POA: Diagnosis not present

## 2021-09-24 DIAGNOSIS — Z419 Encounter for procedure for purposes other than remedying health state, unspecified: Secondary | ICD-10-CM | POA: Diagnosis not present

## 2021-10-25 DIAGNOSIS — Z419 Encounter for procedure for purposes other than remedying health state, unspecified: Secondary | ICD-10-CM | POA: Diagnosis not present

## 2021-11-24 DIAGNOSIS — Z419 Encounter for procedure for purposes other than remedying health state, unspecified: Secondary | ICD-10-CM | POA: Diagnosis not present

## 2021-12-25 DIAGNOSIS — Z419 Encounter for procedure for purposes other than remedying health state, unspecified: Secondary | ICD-10-CM | POA: Diagnosis not present

## 2021-12-25 NOTE — L&D Delivery Note (Signed)
Delivery Note ?At 8:18 AM a viable female was delivered via Vaginal, Spontaneous (Presentation: Left Occiput Anterior).  APGAR: 9, 10; weight 8 lb 0.8 oz (3650 g).   ?Placenta status: Spontaneous, Intact.  Cord: 3 vessels with the following complications: None.   ? ?Anesthesia: None ?Episiotomy: None ?Lacerations: None ?Suture Repair:  n/a ?Est. Blood Loss (mL): 50 ? ?Mom to postpartum.  Baby to Couplet care / Skin to Skin. ? ?Emma Ryan is a 31 y.o. female [phone] with IUP at Unknown admitted for active labor at term. Hx significant for no prenatal care this pregnancy.  She progressed without augmentation to complete and pushed through 2 contractions to deliver.  Cord clamping delayed by 1-3l minutes then clamped by CNM and cut by family member.  Placenta intact and spontaneous, bleeding minimal.  No laceration/ no repair.  Mom and baby stable prior to transfer to postpartum. She plans on bottle feeding She is undecided about birth control. ? ?Sharen Counter ?04/15/2022, 12:25 PM ? ? ? ?

## 2022-01-25 DIAGNOSIS — Z419 Encounter for procedure for purposes other than remedying health state, unspecified: Secondary | ICD-10-CM | POA: Diagnosis not present

## 2022-02-22 DIAGNOSIS — Z419 Encounter for procedure for purposes other than remedying health state, unspecified: Secondary | ICD-10-CM | POA: Diagnosis not present

## 2022-03-25 DIAGNOSIS — Z419 Encounter for procedure for purposes other than remedying health state, unspecified: Secondary | ICD-10-CM | POA: Diagnosis not present

## 2022-04-15 ENCOUNTER — Encounter (HOSPITAL_COMMUNITY): Payer: Self-pay | Admitting: Obstetrics & Gynecology

## 2022-04-15 ENCOUNTER — Other Ambulatory Visit: Payer: Self-pay

## 2022-04-15 ENCOUNTER — Inpatient Hospital Stay (HOSPITAL_COMMUNITY)
Admission: AD | Admit: 2022-04-15 | Discharge: 2022-04-17 | DRG: 807 | Disposition: A | Discharge status: Home / Self Care | Payer: Medicaid Other | Attending: Obstetrics and Gynecology | Admitting: Obstetrics and Gynecology

## 2022-04-15 DIAGNOSIS — O0933 Supervision of pregnancy with insufficient antenatal care, third trimester: Secondary | ICD-10-CM | POA: Diagnosis not present

## 2022-04-15 DIAGNOSIS — Z87891 Personal history of nicotine dependence: Secondary | ICD-10-CM | POA: Diagnosis not present

## 2022-04-15 DIAGNOSIS — Z3A40 40 weeks gestation of pregnancy: Secondary | ICD-10-CM | POA: Diagnosis not present

## 2022-04-15 DIAGNOSIS — Z3A00 Weeks of gestation of pregnancy not specified: Secondary | ICD-10-CM | POA: Diagnosis not present

## 2022-04-15 DIAGNOSIS — O479 False labor, unspecified: Principal | ICD-10-CM | POA: Diagnosis present

## 2022-04-15 DIAGNOSIS — Z23 Encounter for immunization: Secondary | ICD-10-CM

## 2022-04-15 DIAGNOSIS — O26893 Other specified pregnancy related conditions, third trimester: Secondary | ICD-10-CM | POA: Diagnosis not present

## 2022-04-15 DIAGNOSIS — O1002 Pre-existing essential hypertension complicating childbirth: Principal | ICD-10-CM | POA: Diagnosis present

## 2022-04-15 DIAGNOSIS — O0930 Supervision of pregnancy with insufficient antenatal care, unspecified trimester: Secondary | ICD-10-CM

## 2022-04-15 DIAGNOSIS — O135 Gestational [pregnancy-induced] hypertension without significant proteinuria, complicating the puerperium: Secondary | ICD-10-CM | POA: Diagnosis not present

## 2022-04-15 DIAGNOSIS — O0990 Supervision of high risk pregnancy, unspecified, unspecified trimester: Secondary | ICD-10-CM

## 2022-04-15 LAB — COMPREHENSIVE METABOLIC PANEL
ALT: 19 U/L (ref 0–44)
AST: 21 U/L (ref 15–41)
Albumin: 2.6 g/dL — ABNORMAL LOW (ref 3.5–5.0)
Alkaline Phosphatase: 115 U/L (ref 38–126)
Anion gap: 6 (ref 5–15)
BUN: 13 mg/dL (ref 6–20)
CO2: 22 mmol/L (ref 22–32)
Calcium: 8.8 mg/dL — ABNORMAL LOW (ref 8.9–10.3)
Chloride: 110 mmol/L (ref 98–111)
Creatinine, Ser: 0.76 mg/dL (ref 0.44–1.00)
GFR, Estimated: >60 mL/min (ref >60)
Glucose, Bld: 105 mg/dL — ABNORMAL HIGH (ref 70–99)
Potassium: 3.7 mmol/L (ref 3.5–5.1)
Sodium: 138 mmol/L (ref 135–145)
Total Bilirubin: 0.5 mg/dL (ref 0.3–1.2)
Total Protein: 6.5 g/dL (ref 6.5–8.1)

## 2022-04-15 LAB — CBC
HCT: 37 % (ref 36.0–46.0)
Hemoglobin: 12.3 g/dL (ref 12.0–15.0)
MCH: 28.9 pg (ref 26.0–34.0)
MCHC: 33.2 g/dL (ref 30.0–36.0)
MCV: 86.9 fL (ref 80.0–100.0)
Platelets: 275 10*3/uL (ref 150–400)
RBC: 4.26 MIL/uL (ref 3.87–5.11)
RDW: 14.5 % (ref 11.5–15.5)
WBC: 11.4 10*3/uL — ABNORMAL HIGH (ref 4.0–10.5)
nRBC: 0 % (ref 0.0–0.2)

## 2022-04-15 LAB — TYPE AND SCREEN
ABO/RH(D): A POS
Antibody Screen: NEGATIVE

## 2022-04-15 LAB — HEMOGLOBIN A1C
Hgb A1c MFr Bld: 5.3 % (ref 4.8–5.6)
Mean Plasma Glucose: 105.41 mg/dL

## 2022-04-15 LAB — HEPATITIS C ANTIBODY: HCV Ab: NONREACTIVE

## 2022-04-15 LAB — HIV ANTIBODY (ROUTINE TESTING W REFLEX): HIV Screen 4th Generation wRfx: NONREACTIVE

## 2022-04-15 LAB — RPR
RPR Ser Ql: REACTIVE — AB
RPR Titer: 1:2 {titer}

## 2022-04-15 LAB — HEPATITIS B SURFACE ANTIGEN: Hepatitis B Surface Ag: NONREACTIVE

## 2022-04-15 MED ORDER — TETANUS-DIPHTH-ACELL PERTUSSIS 5-2.5-18.5 LF-MCG/0.5 IM SUSY
0.5000 mL | PREFILLED_SYRINGE | Freq: Once | INTRAMUSCULAR | Status: AC
  Administered 2022-04-16: 0.5 mL via INTRAMUSCULAR
  Filled 2022-04-15: qty 0.5

## 2022-04-15 MED ORDER — OXYCODONE HCL 5 MG PO TABS
5.0000 mg | ORAL_TABLET | ORAL | Status: DC | PRN
  Administered 2022-04-15: 5 mg via ORAL
  Filled 2022-04-15: qty 1

## 2022-04-15 MED ORDER — DIPHENHYDRAMINE HCL 25 MG PO CAPS: 25.0000 mg | ORAL_CAPSULE | Freq: Four times a day (QID) | ORAL | Status: DC | PRN

## 2022-04-15 MED ORDER — SIMETHICONE 80 MG PO CHEW: 80.0000 mg | CHEWABLE_TABLET | ORAL | Status: DC | PRN

## 2022-04-15 MED ORDER — SENNOSIDES-DOCUSATE SODIUM 8.6-50 MG PO TABS
2.0000 | ORAL_TABLET | Freq: Every day | ORAL | Status: DC
  Administered 2022-04-16 – 2022-04-17 (×2): 2 via ORAL
  Filled 2022-04-15 (×2): qty 2

## 2022-04-15 MED ORDER — LACTATED RINGERS IV SOLN
INTRAVENOUS | Status: DC
Start: 2022-04-15 — End: 2022-04-15

## 2022-04-15 MED ORDER — ACETAMINOPHEN 325 MG PO TABS
650.0000 mg | ORAL_TABLET | ORAL | Status: DC | PRN
  Administered 2022-04-15 – 2022-04-17 (×6): 650 mg via ORAL
  Filled 2022-04-15 (×7): qty 2

## 2022-04-15 MED ORDER — NIFEDIPINE ER OSMOTIC RELEASE 30 MG PO TB24
30.0000 mg | ORAL_TABLET | Freq: Every day | ORAL | Status: DC
  Administered 2022-04-15 – 2022-04-17 (×3): 30 mg via ORAL
  Filled 2022-04-15 (×3): qty 1

## 2022-04-15 MED ORDER — WITCH HAZEL-GLYCERIN EX PADS
1.0000 | MEDICATED_PAD | CUTANEOUS | Status: DC | PRN
Start: 2022-04-15 — End: 2022-04-17

## 2022-04-15 MED ORDER — ONDANSETRON HCL 4 MG/2ML IJ SOLN: 4.0000 mg | Freq: Four times a day (QID) | INTRAMUSCULAR | Status: DC | PRN

## 2022-04-15 MED ORDER — OXYTOCIN BOLUS FROM INFUSION
333.0000 mL | Freq: Once | INTRAVENOUS | Status: AC
  Administered 2022-04-15: 333 mL via INTRAVENOUS

## 2022-04-15 MED ORDER — SOD CITRATE-CITRIC ACID 500-334 MG/5ML PO SOLN
30.0000 mL | ORAL | Status: DC | PRN
Start: 2022-04-15 — End: 2022-04-15

## 2022-04-15 MED ORDER — OXYCODONE HCL 5 MG PO TABS: 10.0000 mg | ORAL_TABLET | ORAL | Status: DC | PRN

## 2022-04-15 MED ORDER — LIDOCAINE HCL (PF) 1 % IJ SOLN
30.0000 mL | INTRAMUSCULAR | Status: DC | PRN
Start: 2022-04-15 — End: 2022-04-15

## 2022-04-15 MED ORDER — OXYCODONE-ACETAMINOPHEN 5-325 MG PO TABS: 1.0000 | ORAL_TABLET | ORAL | Status: DC | PRN

## 2022-04-15 MED ORDER — ZOLPIDEM TARTRATE 5 MG PO TABS: 5.0000 mg | ORAL_TABLET | Freq: Every evening | ORAL | Status: DC | PRN

## 2022-04-15 MED ORDER — ONDANSETRON HCL 4 MG/2ML IJ SOLN
4.0000 mg | INTRAMUSCULAR | Status: DC | PRN
Start: 2022-04-15 — End: 2022-04-17

## 2022-04-15 MED ORDER — COCONUT OIL OIL
1.0000 | TOPICAL_OIL | Status: DC | PRN
Start: 2022-04-15 — End: 2022-04-17

## 2022-04-15 MED ORDER — OXYTOCIN-SODIUM CHLORIDE 30-0.9 UT/500ML-% IV SOLN
2.5000 [IU]/h | INTRAVENOUS | Status: DC
  Filled 2022-04-15: qty 500

## 2022-04-15 MED ORDER — DIBUCAINE (PERIANAL) 1 % EX OINT: 1.0000 "application " | TOPICAL_OINTMENT | CUTANEOUS | Status: DC | PRN

## 2022-04-15 MED ORDER — ACETAMINOPHEN 325 MG PO TABS
650.0000 mg | ORAL_TABLET | ORAL | Status: DC | PRN
  Administered 2022-04-15: 650 mg via ORAL
  Filled 2022-04-15: qty 2

## 2022-04-15 MED ORDER — ONDANSETRON HCL 4 MG PO TABS: 4.0000 mg | ORAL_TABLET | ORAL | Status: DC | PRN

## 2022-04-15 MED ORDER — PRENATAL MULTIVITAMIN CH
1.0000 | ORAL_TABLET | Freq: Every day | ORAL | Status: DC
  Administered 2022-04-15 – 2022-04-17 (×3): 1 via ORAL
  Filled 2022-04-15 (×3): qty 1

## 2022-04-15 MED ORDER — LACTATED RINGERS IV SOLN: 500.0000 mL | INTRAVENOUS | Status: DC | PRN

## 2022-04-15 MED ORDER — OXYCODONE-ACETAMINOPHEN 5-325 MG PO TABS: 2.0000 | ORAL_TABLET | ORAL | Status: DC | PRN

## 2022-04-15 MED ORDER — TRANEXAMIC ACID-NACL 1000-0.7 MG/100ML-% IV SOLN
INTRAVENOUS | Status: AC
  Filled 2022-04-15: qty 100

## 2022-04-15 MED ORDER — FUROSEMIDE 20 MG PO TABS
20.0000 mg | ORAL_TABLET | Freq: Two times a day (BID) | ORAL | Status: DC
  Administered 2022-04-15 – 2022-04-17 (×4): 20 mg via ORAL
  Filled 2022-04-15 (×4): qty 1

## 2022-04-15 MED ORDER — FENTANYL CITRATE (PF) 100 MCG/2ML IJ SOLN: 100.0000 ug | INTRAMUSCULAR | Status: DC | PRN

## 2022-04-15 MED ORDER — IBUPROFEN 600 MG PO TABS
600.0000 mg | ORAL_TABLET | Freq: Four times a day (QID) | ORAL | Status: DC
  Administered 2022-04-15 – 2022-04-17 (×9): 600 mg via ORAL
  Filled 2022-04-15 (×9): qty 1

## 2022-04-15 MED ORDER — BENZOCAINE-MENTHOL 20-0.5 % EX AERO
1.0000 | INHALATION_SPRAY | CUTANEOUS | Status: DC | PRN
Start: 2022-04-15 — End: 2022-04-17

## 2022-04-15 NOTE — Discharge Summary (Signed)
? ?  Postpartum Discharge Summary ?   ?Patient Name: Emma Ryan [facility] ?DOB: 12-29-1990 ?MRN: [id number] ? ?Date of admission: 04/15/2022 ?Delivery date:04/15/2022  ?Delivering provider: Fatima Blank A  ?Date of discharge: 04/16/2022 ? ?Admitting diagnosis: Uterine contractions [O47.9] ?Intrauterine pregnancy: Unknown     ?Secondary diagnosis:  Principal Problem: ?  Uterine contractions ?Active Problems: ?  No prenatal care in current pregnancy ?  Supervision of high risk pregnancy, antepartum ?  Morgan multipara in labor ? ?Additional problems: Hypertension intrapartum/Postpartum   ?Discharge diagnosis: Term Pregnancy Delivered                                              ?Post partum procedures: None ?Augmentation: AROM ?Complications: None ? ?Hospital course: Onset of Labor With Vaginal Delivery      ?31 y.o. yo [id number] at Unknown was admitted in Active Labor on 04/15/2022. Patient had an uncomplicated labor course as follows:  ?Membrane Rupture Time/Date: 8:16 AM ,04/15/2022   ?Delivery Method:Vaginal, Spontaneous  ?Episiotomy: None  ?Lacerations:  None  ?Patient had an uncomplicated postpartum course.  She is ambulating, tolerating a regular diet, passing flatus, and urinating well. Patient had elevated blood pressures post partum and given her history of cHTN was started on Procardia 20m daily and Lasix 216mfor 5 day course. Patient's RPR was reactive and titer 1:2 (down from 1:4 on 02/05/2020 which was during last pregnancy). Treponemal testing pending. Plan for follow up in 1 week in office. Patient is discharged home in stable condition on 04/16/22. ? ?Newborn Data: ?Birth date:04/15/2022  ?Birth time:8:18 AM  ?Gender:Female  ?Living status:Living  ?Apgars:9 ,10  ?Weight:3650 g  ? ?Magnesium Sulfate received: No ?BMZ received: No ?Rhophylac:No ?MMR:No ?T-DaP: declined ?Flu: No ?Transfusion:No ? ?Physical exam  ?Vitals:  ? 04/15/22 1530 04/15/22 1930 04/15/22 2325 04/16/22 0453  ?BP: 132/72 101/63  125/76 (!) 117/57  ?Pulse: 60 83 62 64  ?Resp: 18 16 18 16   ?Temp: 98.8 ?F (37.1 ?C) 98 ?F (36.7 ?C)  98 ?F (36.7 ?C)  ?TempSrc: Oral Oral  Oral  ?SpO2: 100% 99% 99% 95%  ? ?General: alert ?Lochia: appropriate ?Uterine Fundus: firm ?Incision: N/A ?DVT Evaluation: No evidence of DVT seen on physical exam. ?Labs: ?Lab Results  ?Component Value Date  ? WBC 11.4 (H) 04/15/2022  ? HGB 12.3 04/15/2022  ? HCT 37.0 04/15/2022  ? MCV 86.9 04/15/2022  ? PLT 275 04/15/2022  ? ? ?  Latest Ref Rng & Units 04/15/2022  ?  7:47 AM  ?CMP  ?Glucose 70 - 99 mg/dL 105    ?BUN 6 - 20 mg/dL 13    ?Creatinine 0.44 - 1.00 mg/dL 0.76    ?Sodium 135 - 145 mmol/L 138    ?Potassium 3.5 - 5.1 mmol/L 3.7    ?Chloride 98 - 111 mmol/L 110    ?CO2 22 - 32 mmol/L 22    ?Calcium 8.9 - 10.3 mg/dL 8.8    ?Total Protein 6.5 - 8.1 g/dL 6.5    ?Total Bilirubin 0.3 - 1.2 mg/dL 0.5    ?Alkaline Phos 38 - 126 U/L 115    ?AST 15 - 41 U/L 21    ?ALT 0 - 44 U/L 19    ? ?Edinburgh Score: ? ?  03/31/2020  ?  2:07 PM  ?Edinburgh Postnatal Depression Scale Screening Tool  ?I have been able  to laugh and see the funny side of things. 0  ?I have looked forward with enjoyment to things. 0  ?I have blamed myself unnecessarily when things went wrong. 0  ?I have been anxious or worried for no good reason. 0  ?I have felt scared or panicky for no good reason. 0  ?Things have been getting on top of me. 0  ?I have been so unhappy that I have had difficulty sleeping. 0  ?I have felt sad or miserable. 0  ?I have been so unhappy that I have been crying. 0  ?The thought of harming myself has occurred to me. 0  ?Edinburgh Postnatal Depression Scale Total 0  ? ? ? ?After visit meds:  ?Allergies as of 04/16/2022   ?No Known Allergies ?  ? ?  ?Medication List  ?  ? ?TAKE these medications   ? ?acetaminophen 325 MG tablet ?Commonly known as: Tylenol ?Take 2 tablets (650 mg total) by mouth every 4 (four) hours as needed (for pain scale < 4). ?  ?furosemide 20 MG tablet ?Commonly known as:  LASIX ?Take 1 tablet (20 mg total) by mouth daily for 4 days. ?  ?ibuprofen 600 MG tablet ?Commonly known as: ADVIL ?Take 1 tablet (600 mg total) by mouth every 6 (six) hours. ?  ?NIFEdipine 30 MG 24 hr tablet ?Commonly known as: ADALAT CC ?Take 1 tablet (30 mg total) by mouth daily. ?  ? ?  ? ? ? ?Discharge home in stable condition ?Infant Feeding: Bottle ?Infant Disposition:home with mother ?Discharge instruction: per After Visit Summary and Postpartum booklet. ?Activity: Advance as tolerated. Pelvic rest for 6 weeks.  ?Diet: routine diet ?Future Appointments:No future appointments. ?Follow up Visit: ? ?Message sent to [facility] on 04/15/22: ?Please schedule this patient for a In person postpartum visit in 4 weeks with the following provider: Any provider. ?Additional Postpartum F/U:BP check 1 week  ?High risk pregnancy complicated by:  no prenatal care, HTN ?Delivery mode:  Vaginal, Spontaneous  ?Anticipated Birth Control:  Unsure ? ?Renard Matter, MD, MPH ?OB Fellow, Faculty Practice ? ? ? ?

## 2022-04-15 NOTE — H&P (Signed)
Emma Ryan is a 31 y.o. female presenting for active labor. She has hx uncomplicated vaginal delivery x 6. She has not received prenatal care in this pregnancy. 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  ? ? ?OB History   ? ? Gravida  ?8  ? Para  ?7  ? Term  ?6  ? Preterm  ?0  ? AB  ?0  ? Living  ?7  ?  ? ? SAB  ?0  ? IAB  ?0  ? Ectopic  ?0  ? Multiple  ?0  ? Live Births  ?7  ?   ?  ?  ? ?Past Medical History:  ?Diagnosis Date  ? Infection 08/13  ? Chlamydia and Gonorrhea- was treated  ? ?Past Surgical History:  ?Procedure Laterality Date  ? NO PAST SURGERIES    ? ?Family History: family history includes Diabetes in her paternal grandfather; Hypertension in her maternal aunt. ?Social History:  reports that she has quit smoking. She has never used smokeless tobacco. She reports that she does not drink alcohol and does not use drugs. ? ? ?  ?Maternal Diabetes: No ?Genetic Screening: Declined ?Maternal Ultrasounds/Referrals: Declined ?Fetal Ultrasounds or other Referrals:  None ?Maternal Substance Abuse:  No ?Significant Maternal Medications:  None ?Significant Maternal Lab Results:  None ?Other Comments:   No prenatal care ? ?Review of Systems  ?Constitutional:  Negative for chills, fatigue and fever.  ?Eyes:  Negative for visual disturbance.  ?Respiratory:  Negative for shortness of breath.   ?Cardiovascular:  Negative for chest pain.  ?Gastrointestinal:  Positive for abdominal pain. Negative for vomiting.  ?Genitourinary:  Negative for difficulty urinating, dysuria, flank pain, pelvic pain, vaginal bleeding, vaginal discharge and vaginal pain.  ?Neurological:  Negative for dizziness and headaches.  ?Psychiatric/Behavioral: Negative.    ?Maternal Medical History:  ?Reason for admission: Contractions.  ? ?Contractions: Onset was 3-5 hours ago.   ?Frequency: regular.   ?Perceived severity is strong.   ?Prenatal complications: No prenatal care ?Prenatal Complications -  Diabetes: none. ? ?Dilation: 9 ?Effacement (%): 90 ?Station: -1 ?Exam by:: Welford Roche ?Blood pressure (!) 158/79, pulse 68, temperature 97.8 ?F (36.6 ?C), temperature source Axillary, SpO2 100 %. ?Maternal Exam:  ?Uterine Assessment: Contraction strength is moderate.  Contraction frequency is regular.  ?Abdomen: Fetal presentation: vertex ?Pelvis: adequate for delivery.   ?Cervix: Cervix evaluated by digital exam.   ? ? ?Fetal Exam ?Fetal Monitor Review: Mode: ultrasound.   ?Baseline rate: 135.  ?Variability: moderate (6-25 bpm).   ?Pattern: accelerations present.   ?Fetal State Assessment: Category I - tracings are normal. ? ?Physical Exam ?Vitals and nursing note reviewed.  ?Constitutional:   ?   Appearance: She is well-developed.  ?Cardiovascular:  ?   Rate and Rhythm: Normal rate and regular rhythm.  ?   Heart sounds: Normal heart sounds.  ?Pulmonary:  ?   Effort: Pulmonary effort is normal.  ?Abdominal:  ?   Palpations: Abdomen is soft.  ?Musculoskeletal:     ?   General: Normal range of motion.  ?   Cervical back: Normal range of motion.  ?Skin: ?   General: Skin is warm and dry.  ?Neurological:  ?   Mental Status: She is alert and oriented to person, place, and time.  ?Psychiatric:     ?   Behavior: Behavior normal.     ?   Thought Content: Thought content normal.     ?   Judgment: Judgment  normal.  ?  ?Prenatal labs: ?ABO, Rh: --/--/A POS (04/22 5638) ?Antibody: NEG (04/22 0746) ?Rubella:   pending ?RPR:   pending ?HBsAg:  pending ?HIV:   pending ?GBS:   unknown , PCR pending ? ?Assessment/Plan: ?[id number]  at 40+ weeks with unsure LMP ?GBS unknown ?No prenatal care this pregnancy ? ?Admit to L&D ?GBS PCR and labs pending ?Anticipate NSVD ?[facility] ? ?Misty Stanley Leftwich-Kirby ?04/15/2022, 9:36 AM ? ? ? ? ?

## 2022-04-15 NOTE — MAU Note (Addendum)
Emma Ryan is a 31 y.o. at Unknown here in MAU reporting: ctxs that began this morning @ 0400.  Reports unsure if LOF, panties were wet upon waking up this morning.  Denies VB.  Endorses +FM. ?No PNC with pregnancy d/t limited support system & small children @ home. ?LMP: June 2022 ?Onset of complaint: 0400 today ?Pain score: 8 ?Vitals:  ? 04/15/22 0743  ?BP: 131/69  ?Pulse: 83  ?Temp: 98 ?F (36.7 ?C)  ?SpO2: 100%  ?   ?FHT:135 bpm ?Lab orders placed from triage:   None ?

## 2022-04-15 NOTE — Plan of Care (Signed)
Completed.

## 2022-04-16 ENCOUNTER — Encounter (HOSPITAL_COMMUNITY): Payer: Self-pay | Admitting: Obstetrics and Gynecology

## 2022-04-16 LAB — RUBELLA SCREEN: Rubella: 5.2 index (ref >0.99)

## 2022-04-16 MED ORDER — NIFEDIPINE ER 30 MG PO TB24: 30.0000 mg | ORAL_TABLET | Freq: Every day | ORAL | 0 refills | Status: DC

## 2022-04-16 MED ORDER — IBUPROFEN 600 MG PO TABS: 600.0000 mg | ORAL_TABLET | Freq: Four times a day (QID) | ORAL | 0 refills | Status: DC

## 2022-04-16 MED ORDER — MEDROXYPROGESTERONE ACETATE 150 MG/ML IM SUSP
150.0000 mg | Freq: Once | INTRAMUSCULAR | Status: AC
  Administered 2022-04-16: 150 mg via INTRAMUSCULAR
  Filled 2022-04-16: qty 1

## 2022-04-16 MED ORDER — FUROSEMIDE 20 MG PO TABS: 20.0000 mg | ORAL_TABLET | Freq: Every day | ORAL | 0 refills | Status: DC

## 2022-04-16 MED ORDER — ACETAMINOPHEN 325 MG PO TABS: 650.0000 mg | ORAL_TABLET | ORAL | 0 refills | Status: DC | PRN

## 2022-04-16 NOTE — Progress Notes (Signed)
Infant must stay at least another night for monitoring. Patient would not like discharge today. Notified Dr. Annia Friendly. Maudry Diego Saxon ? ?

## 2022-04-16 NOTE — Clinical Social Work Maternal (Addendum)
?  CLINICAL SOCIAL WORK MATERNAL/CHILD NOTE ? ?Patient Details  ?Name: Emma Ryan ?MRN: [id number] ?Date of Birth: August 07, 1991 ? ?Date:  04/16/2022 ? ?Clinical Social Worker Initiating Note:  Jimmy Picket, [place] Date/Time: Initiated:  04/16/22/1438    ? ?Child's Name:  Emma Ryan  ? ?Biological Parents:  Mother  ? ?Need for Interpreter:  None  ? ?Reason for Referral:  Current Substance Use/Substance Use During Pregnancy  , Late or No Prenatal Care    ? ?Address:  [address] ?[place] [address]  ?  ?Phone number:  [phone] (home)    ? ?Additional phone number:  ? ?Household Members/Support Persons (HM/SP):   Household Member/Support Person 1, Household Member/Support Person 2, Household Member/Support Person 3, Household Member/Support Person 4, Household Member/Support Person 5, Household Member/Support Person 6, Household Member/Support Person 7 ? ? ?HM/SP Name Relationship DOB or Age  ?HM/SP -1 Dezyre daughter 72  ?HM/SP -2 Avion son 37  ?HM/SP -3 Devin son 8  ?HM/SP -4 A'Keem son 7  ?HM/SP -5 Demarcus son 6  ?HM/SP -6 Aidyn Son 5  ?HM/SP -7 DeNae daughter 2  ?HM/SP -8        ? ? ?Natural Supports (not living in the home):  Spouse/significant other, Immediate Family  ? ?Professional Supports:    ? ?Employment: Unemployed  ? ?Type of Work:    ? ?Education:  High school graduate  ? ?Homebound arranged:   ? ?Financial Resources:  Medicaid  ? ?Other Resources:  Sales executive  , [employer]  , [place]  ? ?Cultural/Religious Considerations Which May Impact Care:   ? ?Strengths:  Ability to meet basic needs  , Home prepared for child  , Pediatrician chosen  ? ?Psychotropic Medications:        ? ?Pediatrician:    [place] area ? ?Pediatrician List:  ? ?[facility] ([address])  ?[place]    ?[facility]    ?[facility]    ?[facility]    ?[facility]    ? ? ?Pediatrician Fax Number:   ? ?Risk Factors/Current Problems:  Substance Use  ,  Transportation    ? ?Cognitive State:  Alert    ? ?Mood/Affect:  Relaxed    ? ?CSW Assessment: CSW spoke with MOB at bedside. MOB lives at home with her 7 kids. FOB does not live in her home. Pt states she did not receive prenatal care due to not having transportation and/ or a babysitter. MOB reports her family lives in [place] and she does not have support in [place]. MOB reports occasional THC use. Baby was tested per hospital policy and also positive for THC. CSW informed MOB that due to limited prenatal care and substance use a CPS report has to be made. MOB states she understands.  ? ?CSW made CPS report with Michael Litter at [facility].  ? ?CSW Plan/Description:  No Further Intervention Required/No Barriers to Discharge, Sudden Infant Death Syndrome (SIDS) Education, Perinatal Mood and Anxiety Disorder (PMADs) Education, Child Protective Service Report    ? ? ?Jimmy Picket, LCSW ?04/16/2022, 2:44 PM ? ?

## 2022-04-17 ENCOUNTER — Other Ambulatory Visit (HOSPITAL_COMMUNITY): Payer: Self-pay

## 2022-04-17 MED ORDER — IBUPROFEN 600 MG PO TABS
600.0000 mg | ORAL_TABLET | Freq: Four times a day (QID) | ORAL | 0 refills | Status: AC
Start: 2022-04-17 — End: ?
  Filled 2022-04-17: qty 30, 8d supply, fill #0

## 2022-04-17 MED ORDER — NIFEDIPINE ER 30 MG PO TB24
30.0000 mg | ORAL_TABLET | Freq: Every day | ORAL | 0 refills | Status: AC
Start: 2022-04-17 — End: 2022-06-16
  Filled 2022-04-17: qty 60, 60d supply, fill #0

## 2022-04-17 MED ORDER — PENICILLIN G BENZATHINE 1200000 UNIT/2ML IM SUSY
2.4000 10*6.[IU] | PREFILLED_SYRINGE | Freq: Once | INTRAMUSCULAR | Status: DC
  Filled 2022-04-17: qty 4

## 2022-04-17 MED ORDER — FUROSEMIDE 20 MG PO TABS
20.0000 mg | ORAL_TABLET | Freq: Every day | ORAL | 0 refills | Status: AC
Start: 2022-04-17 — End: 2022-04-21
  Filled 2022-04-17: qty 4, 4d supply, fill #0

## 2022-04-17 MED ORDER — ACETAMINOPHEN 325 MG PO TABS
650.0000 mg | ORAL_TABLET | ORAL | 0 refills | Status: AC | PRN
Start: 2022-04-17 — End: ?
  Filled 2022-04-17: qty 60, 5d supply, fill #0

## 2022-04-17 NOTE — Discharge Summary (Addendum)
? ?  Postpartum Discharge Summary ?   ?Patient Name: Emma Ryan [facility] ?DOB: [birth year] ?MRN: [id number] ? ?Date of admission: 04/15/2022 ?Delivery date:04/15/2022  ?Delivering provider: Fatima Blank A  ?Date of discharge: 04/17/2022 ? ?Admitting diagnosis: Uterine contractions [O47.9] ?Intrauterine pregnancy: Unknown     ?Secondary diagnosis:  Principal Problem: ?  Uterine contractions ?Active Problems: ?  No prenatal care in current pregnancy ?  Supervision of high risk pregnancy, antepartum ?  [place] multipara in labor ? ?Additional problems: Hypertension intrapartum/Postpartum   ?Discharge diagnosis: Term Pregnancy Delivered                                              ?Post partum procedures: None ?Augmentation: AROM ?Complications: None ? ?Hospital course: Onset of Labor With Vaginal Delivery      ?31 y.o. yo [id number] at Unknown was admitted in Active Labor on 04/15/2022. Patient had an uncomplicated labor course as follows:  ?Membrane Rupture Time/Date: 8:16 AM ,04/15/2022   ?Delivery Method:Vaginal, Spontaneous  ?Episiotomy: None  ?Lacerations:  None  ?Patient had an uncomplicated postpartum course.  She is ambulating, tolerating a regular diet, passing flatus, and urinating well. Patient had elevated blood pressures post partum and given her history of cHTN was started on Procardia 12m daily and Lasix 272mfor 5 day course. Patient's RPR was reactive and titer 1:2 (down from 1:4 on 02/05/2020 which was during last pregnancy). Treponemal testing pending. Prior to discharge patient receive Depo-Provera for contraceptive.  ?Patient is discharged home in stable condition on 04/17/22. ? ?Newborn Data: ?Birth date:04/15/2022  ?Birth time:8:18 AM  ?Gender:Female  ?Living status:Living  ?Apgars:9 ,10  ?Weight:3650 g  ? ?Magnesium Sulfate received: No ?BMZ received: No ?Rhophylac:No ?MMR:No ?T-DaP: declined ?Flu: No ?Transfusion:No ? ?Physical exam  ?Vitals:  ? 04/16/22 0453 04/16/22 1249 04/16/22 2150 04/17/22  0520  ?BP: (!) 117/57 127/82 127/67 122/79  ?Pulse: 64 67 70   ?Resp: _0 ?Temp: 98 ?F (36.7 ?C) 97.9 ?F (36.6 ?C) 98.9 ?F (37.2 ?C) 98 ?F (36.7 ?C)  ?TempSrc: Oral Oral Oral Oral  ?SpO2: 95% 100% 100% 99%  ? ?General: alert ?Lochia: appropriate ?Uterine Fundus: firm ?Incision: N/A ?DVT Evaluation: No evidence of DVT seen on physical exam. ?Labs: ?Lab Results  ?Component Value Date  ? WBC 11.4 (H) 04/15/2022  ? HGB 12.3 04/15/2022  ? HCT 37.0 04/15/2022  ? MCV 86.9 04/15/2022  ? PLT 275 04/15/2022  ? ? ?  Latest Ref Rng & Units 04/15/2022  ?  7:47 AM  ?CMP  ?Glucose 70 - 99 mg/dL 105    ?BUN 6 - 20 mg/dL 13    ?Creatinine 0.44 - 1.00 mg/dL 0.76    ?Sodium 135 - 145 mmol/L 138    ?Potassium 3.5 - 5.1 mmol/L 3.7    ?Chloride 98 - 111 mmol/L 110    ?CO2 22 - 32 mmol/L 22    ?Calcium 8.9 - 10.3 mg/dL 8.8    ?Total Protein 6.5 - 8.1 g/dL 6.5    ?Total Bilirubin 0.3 - 1.2 mg/dL 0.5    ?Alkaline Phos 38 - 126 U/L 115    ?AST 15 - 41 U/L 21    ?ALT 0 - 44 U/L 19    ? ?Edinburgh Score: ? ?  04/16/2022  ?  9:50 PM  ?EdFlavia Shipperostnatal Depression Scale Screening Tool  ?I  have been able to laugh and see the funny side of things. 0  ?I have looked forward with enjoyment to things. 1  ?I have blamed myself unnecessarily when things went wrong. 1  ?I have been anxious or worried for no good reason. 0  ?I have felt scared or panicky for no good reason. 0  ?Things have been getting on top of me. 2  ?I have been so unhappy that I have had difficulty sleeping. 0  ?I have felt sad or miserable. 0  ?I have been so unhappy that I have been crying. 0  ?The thought of harming myself has occurred to me. 0  ?Edinburgh Postnatal Depression Scale Total 4  ? ? ? ?After visit meds:  ?Allergies as of 04/17/2022   ?No Known Allergies ?  ? ?  ?Medication List  ?  ? ?TAKE these medications   ? ?acetaminophen 325 MG tablet ?Commonly known as: Tylenol ?Take 2 tablets (650 mg total) by mouth every 4 (four) hours as needed (for pain scale < 4). ?   ?furosemide 20 MG tablet ?Commonly known as: LASIX ?Take 1 tablet (20 mg total) by mouth daily for 4 days. ?  ?ibuprofen 600 MG tablet ?Commonly known as: ADVIL ?Take 1 tablet (600 mg total) by mouth every 6 (six) hours. ?  ?NIFEdipine 30 MG 24 hr tablet ?Commonly known as: ADALAT CC ?Take 1 tablet (30 mg total) by mouth daily. ?  ? ?  ? ? ? ?Discharge home in stable condition ?Infant Feeding: Bottle ?Infant Disposition:home with mother ?Discharge instruction: per After Visit Summary and Postpartum booklet. ?Activity: Advance as tolerated. Pelvic rest for 6 weeks.  ?Diet: routine diet ?Future Appointments:No future appointments. ?Follow up Visit: ? ?Message sent to [facility] on 04/15/22: ?Please schedule this patient for a In person postpartum visit in 4 weeks with the following provider: Any provider. ?Additional Postpartum F/U:BP check 1 week  ?High risk pregnancy complicated by:  no prenatal care, HTN ?Delivery mode:  Vaginal, Spontaneous  ?Anticipated Birth Control:  Unsure ? ?Alen Bleacher, MD ?PGY-1, [facility] Resident  ?  ?Attestation of Supervision of Student:  I confirm that I have verified the information documented in the  resident  student?s note and that I have also personally reperformed the history, physical exam and all medical decision making activities.  I have verified that all services and findings are accurately documented in this student's note; and I agree with management and plan as outlined in the documentation. I have also made any necessary editorial changes. ? ?After consult with ID pharmacy, recommendation is not to treat at this time. This is adequate fall of her titers since her previous treatment. Plan of care updated with team. ? ?Wende Mott, CNM ?[facility] ?04/17/2022 11:03 AM ? ? ? ?

## 2022-04-18 ENCOUNTER — Telehealth: Payer: Self-pay

## 2022-04-18 LAB — T.PALLIDUM AB, TOTAL: T Pallidum Abs: REACTIVE — AB

## 2022-04-18 NOTE — Telephone Encounter (Signed)
Transition Care Management Unsuccessful Follow-up Telephone Call ? ?Date of discharge and from where:  04/17/2022 from [facility] ? ?Attempts:  1st Attempt ? ?Reason for unsuccessful TCM follow-up call:  Left voice message ? ? ? ?

## 2022-04-19 NOTE — Telephone Encounter (Signed)
Transition Care Management Unsuccessful Follow-up Telephone Call ? ?Date of discharge and from where:  04/17/2022-[facility]  ? ?Attempts:  2nd Attempt ? ?Reason for unsuccessful TCM follow-up call:  Left voice message ? ?  ?

## 2022-04-20 NOTE — Telephone Encounter (Signed)
Transition Care Management Unsuccessful Follow-up Telephone Call ? ?Date of discharge and from where:  04/17/2022-[facility]  ? ?Attempts:  3rd Attempt ? ?Reason for unsuccessful TCM follow-up call:  Left voice message ? ?  ?

## 2022-04-24 ENCOUNTER — Ambulatory Visit: Payer: Medicaid Other

## 2022-04-24 DIAGNOSIS — Z419 Encounter for procedure for purposes other than remedying health state, unspecified: Secondary | ICD-10-CM | POA: Diagnosis not present

## 2022-04-25 ENCOUNTER — Telehealth (HOSPITAL_COMMUNITY): Payer: Self-pay | Admitting: *Deleted

## 2022-04-25 NOTE — Telephone Encounter (Signed)
Left phone voicemail message. ? ?Duffy Rhody, RN 04-25-2022 at 10:36am ?

## 2022-05-18 ENCOUNTER — Ambulatory Visit: Payer: Medicaid Other | Admitting: Family Medicine

## 2022-05-25 DIAGNOSIS — Z419 Encounter for procedure for purposes other than remedying health state, unspecified: Secondary | ICD-10-CM | POA: Diagnosis not present

## 2022-06-24 DIAGNOSIS — Z419 Encounter for procedure for purposes other than remedying health state, unspecified: Secondary | ICD-10-CM | POA: Diagnosis not present

## 2022-07-25 DIAGNOSIS — Z419 Encounter for procedure for purposes other than remedying health state, unspecified: Secondary | ICD-10-CM | POA: Diagnosis not present

## 2022-08-05 LAB — GLUCOSE, POCT (MANUAL RESULT ENTRY): POC Glucose: 102 mg/dl — AB (ref 70–99)

## 2022-08-25 DIAGNOSIS — Z419 Encounter for procedure for purposes other than remedying health state, unspecified: Secondary | ICD-10-CM | POA: Diagnosis not present

## 2022-09-24 DIAGNOSIS — Z419 Encounter for procedure for purposes other than remedying health state, unspecified: Secondary | ICD-10-CM | POA: Diagnosis not present

## 2022-10-25 DIAGNOSIS — Z419 Encounter for procedure for purposes other than remedying health state, unspecified: Secondary | ICD-10-CM | POA: Diagnosis not present

## 2022-11-24 DIAGNOSIS — Z419 Encounter for procedure for purposes other than remedying health state, unspecified: Secondary | ICD-10-CM | POA: Diagnosis not present

## 2022-12-25 DIAGNOSIS — Z419 Encounter for procedure for purposes other than remedying health state, unspecified: Secondary | ICD-10-CM | POA: Diagnosis not present

## 2023-01-25 DIAGNOSIS — Z419 Encounter for procedure for purposes other than remedying health state, unspecified: Secondary | ICD-10-CM | POA: Diagnosis not present

## 2023-02-23 DIAGNOSIS — Z419 Encounter for procedure for purposes other than remedying health state, unspecified: Secondary | ICD-10-CM | POA: Diagnosis not present

## 2023-03-26 DIAGNOSIS — Z419 Encounter for procedure for purposes other than remedying health state, unspecified: Secondary | ICD-10-CM | POA: Diagnosis not present

## 2023-04-25 DIAGNOSIS — Z419 Encounter for procedure for purposes other than remedying health state, unspecified: Secondary | ICD-10-CM | POA: Diagnosis not present

## 2023-05-26 DIAGNOSIS — Z419 Encounter for procedure for purposes other than remedying health state, unspecified: Secondary | ICD-10-CM | POA: Diagnosis not present

## 2023-06-25 DIAGNOSIS — Z419 Encounter for procedure for purposes other than remedying health state, unspecified: Secondary | ICD-10-CM | POA: Diagnosis not present

## 2023-07-26 DIAGNOSIS — Z419 Encounter for procedure for purposes other than remedying health state, unspecified: Secondary | ICD-10-CM | POA: Diagnosis not present

## 2023-08-26 DIAGNOSIS — Z419 Encounter for procedure for purposes other than remedying health state, unspecified: Secondary | ICD-10-CM | POA: Diagnosis not present

## 2023-09-25 DIAGNOSIS — Z419 Encounter for procedure for purposes other than remedying health state, unspecified: Secondary | ICD-10-CM | POA: Diagnosis not present

## 2023-10-26 DIAGNOSIS — Z419 Encounter for procedure for purposes other than remedying health state, unspecified: Secondary | ICD-10-CM | POA: Diagnosis not present

## 2023-11-25 DIAGNOSIS — Z419 Encounter for procedure for purposes other than remedying health state, unspecified: Secondary | ICD-10-CM | POA: Diagnosis not present

## 2023-12-26 DIAGNOSIS — Z419 Encounter for procedure for purposes other than remedying health state, unspecified: Secondary | ICD-10-CM | POA: Diagnosis not present

## 2024-01-26 DIAGNOSIS — Z419 Encounter for procedure for purposes other than remedying health state, unspecified: Secondary | ICD-10-CM | POA: Diagnosis not present

## 2024-02-23 DIAGNOSIS — Z419 Encounter for procedure for purposes other than remedying health state, unspecified: Secondary | ICD-10-CM | POA: Diagnosis not present

## 2024-04-05 DIAGNOSIS — Z419 Encounter for procedure for purposes other than remedying health state, unspecified: Secondary | ICD-10-CM | POA: Diagnosis not present

## 2024-05-05 DIAGNOSIS — Z419 Encounter for procedure for purposes other than remedying health state, unspecified: Secondary | ICD-10-CM | POA: Diagnosis not present

## 2024-06-05 DIAGNOSIS — Z419 Encounter for procedure for purposes other than remedying health state, unspecified: Secondary | ICD-10-CM | POA: Diagnosis not present

## 2024-07-05 DIAGNOSIS — Z419 Encounter for procedure for purposes other than remedying health state, unspecified: Secondary | ICD-10-CM | POA: Diagnosis not present

## 2024-07-29 ENCOUNTER — Other Ambulatory Visit: Payer: Self-pay

## 2024-07-29 ENCOUNTER — Ambulatory Visit
Admission: EM | Admit: 2024-07-29 | Discharge: 2024-07-29 | Disposition: A | Discharge status: Home / Self Care | Attending: Family Medicine | Admitting: Family Medicine

## 2024-07-29 DIAGNOSIS — K2900 Acute gastritis without bleeding: Secondary | ICD-10-CM

## 2024-07-29 MED ORDER — LIDOCAINE VISCOUS HCL 2 % MT SOLN
15.0000 mL | Freq: Once | OROMUCOSAL | Status: AC
  Administered 2024-07-29: 15 mL via OROMUCOSAL

## 2024-07-29 MED ORDER — ALUM & MAG HYDROXIDE-SIMETH 200-200-20 MG/5ML PO SUSP
30.0000 mL | Freq: Once | ORAL | Status: AC
  Administered 2024-07-29: 30 mL via ORAL

## 2024-07-29 MED ORDER — FAMOTIDINE 20 MG PO TABS: 20.0000 mg | ORAL_TABLET | Freq: Every day | ORAL | 0 refills | Status: AC

## 2024-07-29 NOTE — ED Triage Notes (Signed)
 Pt heartburn and acid reflux. Pt states wakes up and has to spit a lot and some times [id number].

## 2024-07-29 NOTE — Discharge Instructions (Addendum)
 Start famotidine  daily.  Avoid spicy and fried foods as well as carbonated beverages to help reduce your symptoms.  Please follow-up with your PCP if your symptoms do not improve.  Please go to the ER for any worsening symptoms.  I hope you feel better soon!

## 2024-07-29 NOTE — ED Provider Notes (Signed)
 [facility]    CSN: [id number] Arrival date & time: 07/29/24  1510      History   Chief Complaint Chief Complaint  Patient presents with   Gastroesophageal Reflux    HPI Emma Ryan  is a 33 y.o. female with no significant past medical history presents for acid reflux type symptoms.  Patient reports over the past month she has been having increasing heartburn type symptoms that occur primarily with eating or when she lays down.  States occasional epigastric pain, not consistently, with a burning sensation that radiates up into the back of her throat.  Sometimes she will have nausea with vomiting with it.  Denies any blood in vomit.  She denies any generalized abdominal pain, fevers or chills, diarrhea or constipation or bloating.  She is drinking fluids normally but has a decreased appetite due to her symptoms.  She states she has had like acid reflux type symptoms in the past but they typically resolve with OTC treatments.  She denies any change in her diet or change in medications.  Denies increased NSAID usage.  She has been taking Pepto-Bismol at home with minimal improvement.  No chest pain or shortness of breath.  No history of abdominal surgeries or GI diagnoses such as Crohn's, IBS, colitis.  No other concerns at this time.   Gastroesophageal Reflux    Past Medical History:  Diagnosis Date   Infection 08/13   Chlamydia and Gonorrhea- was treated    Patient Active Problem List   Diagnosis Date Noted   Grand multipara in labor 04/15/2022   Uterine contractions 04/15/2022   Supervision of high risk pregnancy, antepartum 02/03/2020   No prenatal care in current pregnancy 08/24/2015    Past Surgical History:  Procedure Laterality Date   NO PAST SURGERIES      OB History     Gravida  8   Para  8   Term  6   Preterm  0   AB  0   Living  8      SAB  0   IAB  0   Ectopic  0   Multiple  0   Live Births  8            Home  Medications    Prior to Admission medications   Medication Sig Start Date End Date Taking? Authorizing Provider  famotidine  (PEPCID ) 20 MG tablet Take 1 tablet (20 mg total) by mouth daily. 07/29/24 08/28/24 Yes Carman Auxier, Jodi R, NP  acetaminophen  (TYLENOL ) 325 MG tablet Take 2 tablets (650 mg total) by mouth every 4 (four) hours as needed (for pain scale < 4). 04/17/22   Zina Jerilynn LABOR, MD  furosemide  (LASIX ) 20 MG tablet Take 1 tablet (20 mg total) by mouth daily for 4 days. 04/17/22 04/21/22  Zina Jerilynn LABOR, MD  ibuprofen  (ADVIL ) 600 MG tablet Take 1 tablet (600 mg total) by mouth every 6 (six) hours. 04/17/22   Zina Jerilynn LABOR, MD  NIFEdipine  (ADALAT  CC) 30 MG 24 hr tablet Take 1 tablet (30 mg total) by mouth daily. 04/17/22 06/16/22  Zina Jerilynn LABOR, MD    Family History Family History  Problem Relation Age of Onset   Hypertension Maternal Aunt    Diabetes Paternal Grandfather    Hearing loss Neg Hx     Social History Social History   Tobacco Use   Smoking status: Never   Smokeless tobacco: Never   Tobacco comments:    quit prior  to preg  Vaping Use   Vaping status: Never Used  Substance Use Topics   Alcohol use: No   Drug use: No     Allergies   Patient has no known allergies.   Review of Systems Review of Systems  Gastrointestinal:        Acid reflux symptoms     Physical Exam Triage Vital Signs ED Triage Vitals  Encounter Vitals Group     BP 07/29/24 1527 117/82     Girls Systolic BP Percentile --      Girls Diastolic BP Percentile --      Boys Systolic BP Percentile --      Boys Diastolic BP Percentile --      Pulse Rate 07/29/24 1527 77     Resp 07/29/24 1527 16     Temp 07/29/24 1527 98.9 F (37.2 C)     Temp Source 07/29/24 1527 Oral     SpO2 07/29/24 1527 97 %     Weight --      Height --      Head Circumference --      Peak Flow --      Pain Score 07/29/24 1525 10     Pain Loc --      Pain Education --      Exclude from Growth Chart --     No data found.  Updated Vital Signs BP 117/82   Pulse 77   Temp 98.9 F (37.2 C) (Oral)   Resp 16   LMP 07/08/2024   SpO2 97%   Breastfeeding No   Visual Acuity Right Eye Distance:   Left Eye Distance:   Bilateral Distance:    Right Eye Near:   Left Eye Near:    Bilateral Near:     Physical Exam Vitals and nursing note reviewed.  Constitutional:      General: She is not in acute distress.    Appearance: Normal appearance. She is obese. She is not ill-appearing, toxic-appearing or diaphoretic.  HENT:     Head: Normocephalic and atraumatic.  Eyes:     Pupils: Pupils are equal, round, and reactive to light.  Cardiovascular:     Rate and Rhythm: Normal rate.  Pulmonary:     Effort: Pulmonary effort is normal.  Abdominal:     General: Bowel sounds are normal.     Palpations: Abdomen is soft. There is no hepatomegaly or splenomegaly.     Tenderness: There is abdominal tenderness in the epigastric area. There is no guarding or rebound. Negative signs include Rovsing's sign and McBurney's sign.     Comments: Very minimal tenderness to epigastric region.  Skin:    General: Skin is warm and dry.  Neurological:     General: No focal deficit present.     Mental Status: She is alert and oriented to person, place, and time.  Psychiatric:        Mood and Affect: Mood normal.        Behavior: Behavior normal.      UC Treatments / Results  Labs (all labs ordered are listed, but only abnormal results are displayed) Labs Reviewed - No data to display  EKG   Radiology No results found.  Procedures Procedures (including critical care time)  Medications Ordered in UC Medications  alum & mag hydroxide-simeth (MAALOX/MYLANTA) 200-200-20 MG/5ML suspension 30 mL (30 mLs Oral Given 07/29/24 1608)  lidocaine  (XYLOCAINE ) 2 % viscous mouth solution 15 mL (15 mLs Mouth/Throat Given 07/29/24 1608)  Initial Impression / Assessment and Plan / UC Course  I have reviewed the  triage vital signs and the nursing notes.  Pertinent labs & imaging results that were available during my care of the patient were reviewed by me and considered in my medical decision making (see chart for details).     Reviewed exam and symptoms with patient.  No red flags.  Patient reports significant improvement after GI cocktail.  Discussed likely gastritis, will start Pepcid  daily.  Discussed dietary changes/avoidances.  Nursing staff was able to establish her with a PCP where she will follow-up if her symptoms do not improve.  ER precautions reviewed. Final Clinical Impressions(s) / UC Diagnoses   Final diagnoses:  Acute gastritis, presence of bleeding unspecified, unspecified gastritis type     Discharge Instructions      Start famotidine  daily.  Avoid spicy and fried foods as well as carbonated beverages to help reduce your symptoms.  Please follow-up with your PCP if your symptoms do not improve.  Please go to the ER for any worsening symptoms.  I hope you feel better soon!     ED Prescriptions     Medication Sig Dispense Auth. Provider   famotidine  (PEPCID ) 20 MG tablet Take 1 tablet (20 mg total) by mouth daily. 30 tablet Joeline Freer, Jodi R, NP      PDMP not reviewed this encounter.   Loreda Myla SAUNDERS, NP 07/29/24 [phone]

## 2024-08-05 DIAGNOSIS — Z419 Encounter for procedure for purposes other than remedying health state, unspecified: Secondary | ICD-10-CM | POA: Diagnosis not present

## 2024-09-05 DIAGNOSIS — Z419 Encounter for procedure for purposes other than remedying health state, unspecified: Secondary | ICD-10-CM | POA: Diagnosis not present

## 2024-09-10 ENCOUNTER — Ambulatory Visit: Admitting: Internal Medicine

## 2024-09-10 NOTE — Progress Notes (Deleted)
  [facility] [address] [place] [address] Dept: [phone] Dept Fax: [phone]  New Patient Office Visit  Subjective:   Emma Ryan  1991-07-03 09/10/2024  No chief complaint on file.   HPI: Emma Ryan  presents today to establish care at [facility]. Introduced to Publishing rights manager role and practice setting.  All questions answered.  Concerns: See below    The following portions of the patient's history were reviewed and updated as appropriate: past medical history, past surgical history, family history, social history, allergies, medications, and problem list.   Patient Active Problem List   Diagnosis Date Noted   [place] multipara in labor 04/15/2022   Uterine contractions 04/15/2022   Supervision of high risk pregnancy, antepartum 02/03/2020   No prenatal care in current pregnancy 08/24/2015   Past Medical History:  Diagnosis Date   Infection 08/13   Chlamydia and Gonorrhea- was treated   Past Surgical History:  Procedure Laterality Date   NO PAST SURGERIES     Family History  Problem Relation Age of Onset   Hypertension Maternal Aunt    Diabetes Paternal Grandfather    Hearing loss Neg Hx     Current Outpatient Medications:    acetaminophen  (TYLENOL ) 325 MG tablet, Take 2 tablets (650 mg total) by mouth every 4 (four) hours as needed (for pain scale < 4)., Disp: 60 tablet, Rfl: 0   famotidine  (PEPCID ) 20 MG tablet, Take 1 tablet (20 mg total) by mouth daily., Disp: 30 tablet, Rfl: 0   furosemide  (LASIX ) 20 MG tablet, Take 1 tablet (20 mg total) by mouth daily for 4 days., Disp: 4 tablet, Rfl: 0   ibuprofen  (ADVIL ) 600 MG tablet, Take 1 tablet (600 mg total) by mouth every 6 (six) hours., Disp: 30 tablet, Rfl: 0   NIFEdipine  (ADALAT  CC) 30 MG 24 hr tablet, Take 1 tablet (30 mg total) by mouth daily., Disp: 60 tablet, Rfl: 0 No Known  Allergies  ROS: A complete ROS was performed with pertinent positives/negatives noted in the HPI. The remainder of the ROS are negative.   Objective:   There were no vitals filed for this visit.  GENERAL: Well-appearing, in NAD. Well nourished.  SKIN: Pink, warm and dry. No rash, lesion, ulceration, or ecchymoses.  NECK: Trachea midline. Full ROM w/o pain or tenderness. No lymphadenopathy.  RESPIRATORY: Chest wall symmetrical. Respirations even and non-labored. Breath sounds clear to auscultation bilaterally.  CARDIAC: S1, S2 present, regular rate and rhythm. Peripheral pulses 2+ bilaterally.  EXTREMITIES: Without clubbing, cyanosis, or edema.  NEUROLOGIC: No motor or sensory deficits. Steady, even gait.  PSYCH/MENTAL STATUS: Alert, oriented x 3. Cooperative, appropriate mood and affect.   Health Maintenance Due  Topic Date Due   Hepatitis B Vaccines 19-59 Average Risk (1 of 3 - 19+ 3-dose series) Never done   HPV VACCINES (1 - 3-dose SCDM series) Never done   Cervical Cancer Screening (HPV/Pap Cotest)  Never done   Influenza Vaccine  07/25/2024   COVID-19 Vaccine (1 - 2024-25 season) Never done    No results found for any visits on 09/10/24.  Assessment & Plan:   There are no diagnoses linked to this encounter.  No orders of the defined types were placed in this encounter.  No orders of the defined types were placed in this encounter.   No follow-ups on file.   Rosina Senters, FNP
